# Patient Record
Sex: Female | Born: 1960 | Race: White | Hispanic: No | Marital: Married | Smoking: Former smoker
Health system: Southern US, Community
[De-identification: ages and names within clinical notes are randomized; demographics above are authoritative.]

## PROBLEM LIST (undated history)

## (undated) DIAGNOSIS — J189 Pneumonia, unspecified organism: Secondary | ICD-10-CM

## (undated) DIAGNOSIS — E0590 Thyrotoxicosis, unspecified without thyrotoxic crisis or storm: Secondary | ICD-10-CM

## (undated) DIAGNOSIS — K219 Gastro-esophageal reflux disease without esophagitis: Secondary | ICD-10-CM

## (undated) DIAGNOSIS — Z8619 Personal history of other infectious and parasitic diseases: Secondary | ICD-10-CM

## (undated) DIAGNOSIS — L821 Other seborrheic keratosis: Secondary | ICD-10-CM

## (undated) DIAGNOSIS — E079 Disorder of thyroid, unspecified: Secondary | ICD-10-CM

## (undated) DIAGNOSIS — F419 Anxiety disorder, unspecified: Secondary | ICD-10-CM

## (undated) DIAGNOSIS — E039 Hypothyroidism, unspecified: Secondary | ICD-10-CM

## (undated) DIAGNOSIS — D649 Anemia, unspecified: Secondary | ICD-10-CM

## (undated) DIAGNOSIS — G4700 Insomnia, unspecified: Secondary | ICD-10-CM

## (undated) DIAGNOSIS — N905 Atrophy of vulva: Secondary | ICD-10-CM

## (undated) HISTORY — DX: Other seborrheic keratosis: L82.1

## (undated) HISTORY — DX: Anemia, unspecified: D64.9

## (undated) HISTORY — DX: Disorder of thyroid, unspecified: E07.9

## (undated) HISTORY — DX: Atrophy of vulva: N90.5

## (undated) HISTORY — PX: ABDOMINAL HYSTERECTOMY: SHX81

## (undated) HISTORY — DX: Insomnia, unspecified: G47.00

## (undated) HISTORY — DX: Personal history of other infectious and parasitic diseases: Z86.19

## (undated) HISTORY — DX: Anxiety disorder, unspecified: F41.9

---
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
## 2001-03-20 HISTORY — PX: SUPRACERVICAL ABDOMINAL HYSTERECTOMY: SHX5393

## 2004-01-12 ENCOUNTER — Ambulatory Visit: Payer: Self-pay

## 2005-03-24 ENCOUNTER — Ambulatory Visit: Payer: Self-pay

## 2006-01-19 ENCOUNTER — Ambulatory Visit: Payer: Self-pay | Admitting: Internal Medicine

## 2007-01-22 ENCOUNTER — Ambulatory Visit: Payer: Self-pay | Admitting: Internal Medicine

## 2007-01-22 ENCOUNTER — Ambulatory Visit: Payer: Self-pay | Admitting: Family Medicine

## 2008-01-29 ENCOUNTER — Ambulatory Visit: Payer: Self-pay | Admitting: Internal Medicine

## 2009-02-25 ENCOUNTER — Ambulatory Visit: Payer: Self-pay | Admitting: Internal Medicine

## 2009-03-10 ENCOUNTER — Ambulatory Visit: Payer: Self-pay | Admitting: Internal Medicine

## 2009-08-25 ENCOUNTER — Ambulatory Visit: Payer: Self-pay | Admitting: Otolaryngology

## 2009-09-27 ENCOUNTER — Ambulatory Visit: Payer: Self-pay | Admitting: Otolaryngology

## 2010-04-06 ENCOUNTER — Ambulatory Visit: Payer: Self-pay

## 2010-04-07 ENCOUNTER — Ambulatory Visit: Payer: Self-pay

## 2010-10-20 ENCOUNTER — Ambulatory Visit: Payer: Self-pay

## 2011-03-16 ENCOUNTER — Ambulatory Visit: Payer: Self-pay

## 2011-03-17 DIAGNOSIS — E559 Vitamin D deficiency, unspecified: Secondary | ICD-10-CM | POA: Insufficient documentation

## 2011-04-11 ENCOUNTER — Ambulatory Visit: Payer: Self-pay

## 2011-07-20 ENCOUNTER — Ambulatory Visit: Payer: Self-pay | Admitting: Internal Medicine

## 2012-03-11 LAB — HM PAP SMEAR: HM PAP: NEGATIVE

## 2012-03-22 ENCOUNTER — Ambulatory Visit: Payer: Self-pay | Admitting: Internal Medicine

## 2012-05-01 ENCOUNTER — Ambulatory Visit: Payer: Self-pay

## 2012-06-18 HISTORY — PX: THYROIDECTOMY: SHX17

## 2012-06-19 ENCOUNTER — Ambulatory Visit: Payer: Self-pay | Admitting: Anesthesiology

## 2012-06-19 LAB — BASIC METABOLIC PANEL
Anion Gap: 6 — ABNORMAL LOW (ref 7–16)
BUN: 9 mg/dL (ref 7–18)
Calcium, Total: 8.7 mg/dL (ref 8.5–10.1)
Chloride: 106 mmol/L (ref 98–107)
Co2: 26 mmol/L (ref 21–32)
Creatinine: 0.66 mg/dL (ref 0.60–1.30)
EGFR (African American): >60
EGFR (Non-African Amer.): >60
Glucose: 97 mg/dL (ref 65–99)
Osmolality: 274 (ref 275–301)
Potassium: 4 mmol/L (ref 3.5–5.1)
Sodium: 138 mmol/L (ref 136–145)

## 2012-06-19 LAB — CBC
HCT: 37 % (ref 35.0–47.0)
HGB: 12.8 g/dL (ref 12.0–16.0)
MCHC: 34.5 g/dL (ref 32.0–36.0)
MCV: 94 fL (ref 80–100)
Platelet: 247 10*3/uL (ref 150–440)
RBC: 3.95 10*6/uL (ref 3.80–5.20)
WBC: 8.2 10*3/uL (ref 3.6–11.0)

## 2012-06-19 LAB — T4, FREE: Free Thyroxine: 0.92 ng/dL (ref 0.76–1.46)

## 2012-06-24 ENCOUNTER — Ambulatory Visit: Payer: Self-pay | Admitting: Otolaryngology

## 2012-06-26 LAB — PATHOLOGY REPORT

## 2013-03-11 ENCOUNTER — Ambulatory Visit: Payer: Self-pay | Admitting: Family Medicine

## 2013-03-11 LAB — HM DEXA SCAN

## 2013-05-13 ENCOUNTER — Ambulatory Visit: Payer: Self-pay | Admitting: Family Medicine

## 2013-05-26 ENCOUNTER — Ambulatory Visit: Payer: Self-pay | Admitting: Family Medicine

## 2013-05-26 LAB — HM MAMMOGRAPHY: HM Mammogram: NEGATIVE

## 2014-03-16 ENCOUNTER — Ambulatory Visit: Payer: Self-pay | Admitting: Family Medicine

## 2014-03-24 ENCOUNTER — Ambulatory Visit: Payer: Self-pay | Admitting: Family Medicine

## 2014-03-31 ENCOUNTER — Ambulatory Visit: Payer: Self-pay | Admitting: Family Medicine

## 2014-07-10 NOTE — Op Note (Signed)
PATIENT NAME:  Lisa Simmons, Lisa Simmons MR#:  [id number] DATE OF BIRTH:  Jul 17, 1960  DATE OF PROCEDURE:  06/24/2012  PREOPERATIVE DIAGNOSES:  1. Multiple thyroid nodules with thyroid enlargement.  2. Hyperthyroidism.  3. Dysphasia.   POSTOPERATIVE DIAGNOSES:  1. Multiple thyroid nodules with thyroid enlargement.  2. Hyperthyroidism.  3. Dysphasia.   OPERATIVE PROCEDURE: Total thyroidectomy.  SURGEON: Margaretha Sheffield, MD  ASSISTANT: Jerene Bears, MD  ANESTHESIA: General.   COMPLICATIONS: None.   TOTAL ESTIMATED BLOOD LOSS: 75 mL.   DESCRIPTION OF PROCEDURE: The patient was given general anesthesia by oral endotracheal intubation. A special monitored endotracheal tube was used to be able to continuously monitor the laryngeal nerves throughout the procedure. The head was extended some. A small shoulder roll was placed. The neck was marked about 2 fingerbreadths above the sternal notch and 6 mL of 1% Xylocaine with epi 1:100,000 was used for infiltration of the skin in this area. She was prepped and draped in sterile fashion.   Incision was made through the skin and subcu down to the platysmal layer. This was divided as well. There was very little platysma medially. The incision line itself was only about 2.5 inches long. The strap muscles were divided in the midline and the right side was addressed first. There was a very large nodule that was coming across the midline and was stretching the strap muscles significantly. The strap muscles were densely adherent to the thyroid gland in this area and had to be sharply dissected. There was a significant amount of oozing. Once I got around the lateral border of the large nodule until there were inferiorly attachments. I freed up and found two large nodules inferiorly to the thyroid gland. These were freed up as well and brought up into the wound and tried to rotate some. The gland was still attached superiorly. There was one parathyroid gland that was  found inferiorly. Dissection was carried long using the Harmonic scalpel and blunt dissection. Superiorly, the thyroid gland went up quite high. The superior attachments were freed up to show the superior thyroid vessels. These were cut across using the Harmonic scalpel. Once the superior border was freed up and I could loosen up some of the superior thyroid, the remaining portion of the gland could be medialized. A superior thyroid gland was found as the attachments were being taken down. An inferior parathyroid gland was found on the inferior portion as well. Medially, the recurrent laryngeal nerve was found. Once the gland was rotated, this came into clear view. It was relatively large and stimulated well. There were no attachments near it and the Berry's ligament was cut across with the nerve in direct view to make sure there was no injury to the nerve. The remaining attachments to the trachea were freed up and the entire right thyroid gland was removed and a very large middle nodule with inferior nodules and upper lobe that looked more normal. This was left attached at the thyroid gland. During this time, it was felt that the electrodes on the orotracheal tube had moved some and was not getting as good a stimulation again, and this was visualized, and you could see that this had gone down into the trachea and was now not stimulating properly. It was pulled back some, but it was hard to tell where there was blood on this and not working right, so the tube was removed and a fresh oral endotracheal tube was placed with a fresh electrode on it. This  was then checked and was seemingly working much better. We were not getting the constant feedback from it, and the right nerve was stimulated and worked appropriately.   Dissection on the left gland was then done, elevating the strap muscles and going around the lateral border. Superiorly, the attachments were loosened up the superior pole. This went quite high and  posterior. As I freed it up, I found the superior laryngeal nerve and this was dissected freed and left intact. Once the superior attachments were freed, I could pull this down, I could medialize the gland somewhat. There were inferior attachments as we pulled it medially and could feel two more nodules that were inferior and lateral. These were freed up from the overlying tissue and medialized as well. The inferior attachments were freed up and the inferior thyroid vessels were cut across with the Harmonic as well. The recurrent laryngeal nerve was then found lying in the bed. There was one superior parathyroid that was found and left intact. I was not sure if I found an inferior one on this side. There was a lot of fatty tissue that was interspersed in the neck. The Berry's ligament was come across and freed up while the nerve was in direct vision. The nerve was stimulated before and after to make sure that was still continuing to work well. This freed up the entire gland and the whole gland was removed. One of the inferior nodules tore off of the specimen while it was being retracted and this was sent separately. This was from the left inferior thyroid gland. The specimen was tagged at the left superior pole.   The wound was irrigated on both sides. There was no obvious bleeding on either side. The nerves were clearly seen and could be stimulated with good response. Surgicel was placed in the bed and then #10-French TLS drains were placed bilaterally crisscrossed in the midline to place the drains in the bed. These were placed to low continuous Vacutainer suction. The wound was then closed using 4-0 Vicryl for the strap muscles and then the platysma was closed with 4-0 Vicryl. The dermis was then closed with 4-0 Vicryl and the skin edges were held in apposition with a 6-0 Prolene running locking suture. The wound was covered then with bacitracin followed by Telfa and Tegaderm. The drains were taped to the skin.  The patient tolerated the procedure well. She was awakened and taken to the recovery room in satisfactory condition. There were no operative complications.    ____________________________ Huey Romans, MD phj:es D: 06/24/2012 11:37:23 ET T: 06/24/2012 12:46:23 ET JOB#: [id number]  cc: Huey Romans, MD, <Dictator> Huey Romans MD ELECTRONICALLY SIGNED 07/08/2012 8:37

## 2014-08-27 ENCOUNTER — Ambulatory Visit
Admission: RE | Admit: 2014-08-27 | Payer: BC Managed Care – PPO | Source: Ambulatory Visit | Admitting: Gastroenterology

## 2014-08-27 ENCOUNTER — Encounter: Admission: RE | Payer: Self-pay | Source: Ambulatory Visit

## 2014-08-27 SURGERY — COLONOSCOPY
Anesthesia: Choice

## 2015-02-21 LAB — BASIC METABOLIC PANEL
BUN: 9 mg/dL (ref 4–21)
CREATININE: 0.7 mg/dL (ref 0.5–1.1)
GLUCOSE: 116 mg/dL
POTASSIUM: 3.9 mmol/L (ref 3.4–5.3)
Sodium: 140 mmol/L (ref 137–147)

## 2015-02-21 LAB — TSH: TSH: 0.18 u[IU]/mL — AB (ref 0.41–5.90)

## 2015-03-10 ENCOUNTER — Encounter: Payer: Self-pay | Admitting: Family Medicine

## 2015-03-11 ENCOUNTER — Encounter: Payer: Self-pay | Admitting: Family Medicine

## 2015-03-11 DIAGNOSIS — M1990 Unspecified osteoarthritis, unspecified site: Secondary | ICD-10-CM | POA: Insufficient documentation

## 2015-03-11 DIAGNOSIS — G4700 Insomnia, unspecified: Secondary | ICD-10-CM | POA: Insufficient documentation

## 2015-03-11 DIAGNOSIS — F419 Anxiety disorder, unspecified: Secondary | ICD-10-CM | POA: Insufficient documentation

## 2015-03-11 DIAGNOSIS — Z889 Allergy status to unspecified drugs, medicaments and biological substances status: Secondary | ICD-10-CM | POA: Insufficient documentation

## 2015-03-11 DIAGNOSIS — N951 Menopausal and female climacteric states: Secondary | ICD-10-CM | POA: Insufficient documentation

## 2015-03-11 DIAGNOSIS — E890 Postprocedural hypothyroidism: Secondary | ICD-10-CM | POA: Insufficient documentation

## 2015-03-11 DIAGNOSIS — R51 Headache: Secondary | ICD-10-CM

## 2015-03-11 DIAGNOSIS — R519 Headache, unspecified: Secondary | ICD-10-CM | POA: Insufficient documentation

## 2015-03-11 DIAGNOSIS — G8929 Other chronic pain: Secondary | ICD-10-CM | POA: Insufficient documentation

## 2015-03-11 DIAGNOSIS — M8580 Other specified disorders of bone density and structure, unspecified site: Secondary | ICD-10-CM | POA: Insufficient documentation

## 2015-03-16 ENCOUNTER — Other Ambulatory Visit: Payer: Self-pay | Admitting: Family Medicine

## 2015-03-16 ENCOUNTER — Encounter: Payer: Self-pay | Admitting: Family Medicine

## 2015-03-16 ENCOUNTER — Ambulatory Visit (INDEPENDENT_AMBULATORY_CARE_PROVIDER_SITE_OTHER): Payer: BC Managed Care – PPO | Admitting: Family Medicine

## 2015-03-16 VITALS — BP 120/84 | HR 85 | Temp 98.2°F | Resp 16 | Ht 63.16 in | Wt 162.0 lb

## 2015-03-16 DIAGNOSIS — E559 Vitamin D deficiency, unspecified: Secondary | ICD-10-CM

## 2015-03-16 DIAGNOSIS — Z0000 Encounter for general adult medical examination without abnormal findings: Secondary | ICD-10-CM

## 2015-03-16 DIAGNOSIS — Z1231 Encounter for screening mammogram for malignant neoplasm of breast: Secondary | ICD-10-CM

## 2015-03-16 NOTE — Progress Notes (Signed)
Patient: Lisa Simmons, Female    DOB: 03-Aug-1960, 54 y.o.   MRN: [id number] Visit Date: 03/16/2015  Today's Provider: Lelon Huh, MD   Chief Complaint  Patient presents with  . Annual Exam  . Hypothyroidism  . Anxiety   Subjective:    Annual physical exam Lisa Simmons is a 54 y.o. female who presents today for health maintenance and complete physical. She feels well. She reports exercising none. She reports she is sleeping poorly.  -----------------------------------------------------------------  Follow-up for Vitamin D Deficiency from 03/10/2014; recommended that she start a vitamin D supplement 2,000 units qd. Follow-up Anxiety from 03/10/2014; no changes were made. Follow-up for hypothyroidism from 03/10/2014; decreased levothyroxine to 75 mcg. Sees Dr Ladene Artist yearly who monitors thyroid functions.       Review of Systems  Constitutional: Positive for chills, diaphoresis, fatigue and unexpected weight change.  HENT: Positive for voice change.        Deeper voice  Eyes: Positive for visual disturbance.  Respiratory: Negative.   Cardiovascular: Positive for palpitations.  Gastrointestinal: Negative.   Endocrine: Positive for cold intolerance and heat intolerance.  Genitourinary: Negative.   Musculoskeletal: Positive for back pain and arthralgias.  Skin: Negative.   Allergic/Immunologic: Negative.   Neurological: Negative.   Hematological: Negative.   Psychiatric/Behavioral: Positive for sleep disturbance and decreased concentration.       Having troubled staying asleep/6-7 hours per night    Social History      She  reports that she quit smoking about 29 years ago. Her smoking use included Cigarettes. She has a 7 pack-year smoking history. She does not have any smokeless tobacco history on file. She reports that she drinks alcohol. She reports that she does not use illicit drugs.       Social History   Social History  . Marital Status: Married    Spouse Name: N/A  . Number of Children: 3  . Years of Education: N/A   Occupational History  . Teacher    Social History Main Topics  . Smoking status: Former Smoker -- 1.00 packs/day for 7 years    Types: Cigarettes    Quit date: 03/20/1986  . Smokeless tobacco: None  . Alcohol Use: 0.0 oz/week    0 Standard drinks or equivalent per week     Comment: 1-2 drinks der year  . Drug Use: No  . Sexual Activity: Not Asked   Other Topics Concern  . None   Social History Narrative    Past Medical History  Diagnosis Date  . History of chicken pox   . History of measles   . History of mumps      Patient Active Problem List   Diagnosis Date Noted  . History of allergy 03/11/2015  . Anxiety 03/11/2015  . Arthritis 03/11/2015  . Chronic headache 03/11/2015  . Hypothyroidism, postop 03/11/2015  . Insomnia 03/11/2015  . Menopausal symptom 03/11/2015  . Osteopenia 03/11/2015  . Vitamin D deficiency 03/17/2011    Past Surgical History  Procedure Laterality Date  . Thyroidectomy  06/2012    Dr. Jerral Bonito; due to multinodular goiter  . Abdominal hysterectomy  2003    due to Endometriosis and severe Anemiaabdominal; Laproscopic; still has ovaries and vervix  . Cesarean section  1989  . Dilation and curettage of uterus  1983    x2  . Tonsillectomy  1970    Family History        Family Status  Relation  Status Death Age  . Mother Alive   . Father Deceased     Cause of Death: overdose; substance abuse  . Sister Alive     substance abuse  . Maternal Grandfather Deceased   . Paternal Grandfather Deceased   . Sister Alive   . Sister Alive   . Paternal Grandmother Deceased         Her family history includes Alcohol abuse in her sister; Depression in her father; Heart disease in her father, maternal grandfather, maternal grandmother, paternal grandfather, and paternal grandmother; Hypothyroidism in her mother; Stroke in her maternal grandfather.    Allergies  Allergen  Reactions  . Avelox  [Moxifloxacin Hcl In Nacl]     Other reaction(s): Rapid pulse  . Iodine     Other reaction(s): Hives  . Penicillin G Rash    Previous Medications   ALPRAZOLAM (NIRAVAM) 0.25 MG DISSOLVABLE TABLET    Take 1 tablet by mouth at bedtime as needed.   LEVOTHYROXINE (SYNTHROID, LEVOTHROID) 100 MCG TABLET    Take 1 tablet by mouth every other day. Reported on 03/16/2015   LEVOTHYROXINE (SYNTHROID, LEVOTHROID) 88 MCG TABLET    Take 1 tablet by mouth every other day.   SERTRALINE (ZOLOFT) 50 MG TABLET    Take 1 tablet by mouth daily.    Patient Care Team: Birdie Sons, MD as PCP - General (Family Medicine) Margaretha Sheffield, MD (Otolaryngology)     Objective:   Vitals: BP 120/84 mmHg  Pulse 85  Temp(Src) 98.2 F (36.8 C) (Oral)  Resp 16  Ht 5' 3.16" (1.604 m)  Wt 162 lb (73.483 kg)  BMI 28.56 kg/m2  SpO2 96%  LMP 03/16/2015   Physical Exam   General Appearance:    Alert, cooperative, no distress, appears stated age  Head:    Normocephalic, without obvious abnormality, atraumatic  Eyes:    PERRL, conjunctiva/corneas clear, EOM's intact, fundi    benign, both eyes  Ears:    Normal TM's and external ear canals, both ears  Nose:   Nares normal, septum midline, mucosa normal, no drainage    or sinus tenderness  Throat:   Lips, mucosa, and tongue normal; teeth and gums normal  Neck:   Supple, symmetrical, trachea midline, no adenopathy;    thyroid:  no enlargement/tenderness/nodules; no carotid   bruit or JVD  Back:     Symmetric, no curvature, ROM normal, no CVA tenderness  Lungs:     Clear to auscultation bilaterally, respirations unlabored  Chest Wall:    No tenderness or deformity   Heart:    Regular rate and rhythm, S1 and S2 normal, no murmur, rub   or gallop  Breast Exam:    normal appearance, no masses or tenderness, deferred  Abdomen:     Soft, non-tender, bowel sounds active all four quadrants,    no masses, no organomegaly  Pelvic:    deferred    Extremities:   Extremities normal, atraumatic, no cyanosis or edema  Pulses:   2+ and symmetric all extremities  Skin:   Skin color, texture, turgor normal, no rashes or lesions  Lymph nodes:   Cervical, supraclavicular, and axillary nodes normal  Neurologic:   CNII-XII intact, normal strength, sensation and reflexes    throughout     Depression Screen PHQ 2/9 Scores 03/16/2015  PHQ - 2 Score 0  PHQ- 9 Score 5      Assessment & Plan:     Routine Health Maintenance and Physical Exam  Exercise Activities and Dietary recommendations Goals    None      Immunization History  Administered Date(s) Administered  . Tdap 03/10/2013    Health Maintenance  Topic Date Due  . HIV Screening  06/18/1977  . COLONOSCOPY  06/18/2012  . INFLUENZA VACCINE  Had at work  . PAP SMEAR  No longer indicated  . MAMMOGRAM  05/27/2015  . TETANUS/TDAP  03/11/2023      Discussed health benefits of physical activity, and encouraged her to engage in regular exercise appropriate for her age and condition.    --------------------------------------------------------------------  1. Annual physical exam She is scared to do prep for colonoscopy since she has history of passing out prior to vomiting. She was given order form for Cologuard and advised this should be done every 3 years as alternative to colonoscopy.  - Lipid panel - Comprehensive metabolic panel  2. Vitamin D deficiency  - VITAMIN D 25 Hydroxy (Vit-D Deficiency, Fractures)

## 2015-03-16 NOTE — Patient Instructions (Signed)
Please call the [facility] at [facility] at [phone] to schedule your mammogram.

## 2015-03-17 ENCOUNTER — Ambulatory Visit
Admission: RE | Admit: 2015-03-17 | Discharge: 2015-03-17 | Disposition: A | Discharge status: Home / Self Care | Payer: BC Managed Care – PPO | Source: Ambulatory Visit | Attending: Family Medicine | Admitting: Family Medicine

## 2015-03-17 DIAGNOSIS — Z1231 Encounter for screening mammogram for malignant neoplasm of breast: Secondary | ICD-10-CM | POA: Diagnosis not present

## 2015-03-18 LAB — COMPREHENSIVE METABOLIC PANEL
A/G RATIO: 1.9 (ref 1.1–2.5)
ALBUMIN: 4.4 g/dL (ref 3.5–5.5)
ALT: 16 IU/L (ref 0–32)
AST: 16 IU/L (ref 0–40)
Alkaline Phosphatase: 101 IU/L (ref 39–117)
BILIRUBIN TOTAL: 0.4 mg/dL (ref 0.0–1.2)
BUN / CREAT RATIO: 15 (ref 9–23)
BUN: 11 mg/dL (ref 6–24)
CALCIUM: 9.2 mg/dL (ref 8.7–10.2)
CHLORIDE: 103 mmol/L (ref 96–106)
CO2: 26 mmol/L (ref 18–29)
Creatinine, Ser: 0.72 mg/dL (ref 0.57–1.00)
GFR, EST AFRICAN AMERICAN: 111 mL/min/{1.73_m2} (ref >59)
GFR, EST NON AFRICAN AMERICAN: 97 mL/min/{1.73_m2} (ref >59)
GLOBULIN, TOTAL: 2.3 g/dL (ref 1.5–4.5)
Glucose: 104 mg/dL — ABNORMAL HIGH (ref 65–99)
POTASSIUM: 4.4 mmol/L (ref 3.5–5.2)
SODIUM: 143 mmol/L (ref 134–144)
TOTAL PROTEIN: 6.7 g/dL (ref 6.0–8.5)

## 2015-03-18 LAB — LIPID PANEL
Chol/HDL Ratio: 3.5 ratio units (ref 0.0–4.4)
Cholesterol, Total: 212 mg/dL — ABNORMAL HIGH (ref 100–199)
HDL: 61 mg/dL (ref >39)
LDL Calculated: 131 mg/dL — ABNORMAL HIGH (ref 0–99)
TRIGLYCERIDES: 100 mg/dL (ref 0–149)
VLDL CHOLESTEROL CAL: 20 mg/dL (ref 5–40)

## 2015-03-18 LAB — VITAMIN D 25 HYDROXY (VIT D DEFICIENCY, FRACTURES): VIT D 25 HYDROXY: 18.6 ng/mL — AB (ref 30.0–100.0)

## 2015-03-22 LAB — COLOGUARD: COLOGUARD: NEGATIVE

## 2015-03-23 ENCOUNTER — Other Ambulatory Visit: Payer: Self-pay | Admitting: Family Medicine

## 2015-03-23 DIAGNOSIS — N63 Unspecified lump in unspecified breast: Secondary | ICD-10-CM

## 2015-03-29 ENCOUNTER — Ambulatory Visit
Admission: RE | Admit: 2015-03-29 | Discharge: 2015-03-29 | Disposition: A | Discharge status: Home / Self Care | Payer: BC Managed Care – PPO | Source: Ambulatory Visit | Attending: Family Medicine | Admitting: Family Medicine

## 2015-03-29 DIAGNOSIS — N63 Unspecified lump in unspecified breast: Secondary | ICD-10-CM

## 2015-03-29 DIAGNOSIS — N6002 Solitary cyst of left breast: Secondary | ICD-10-CM | POA: Diagnosis not present

## 2015-04-02 ENCOUNTER — Encounter: Payer: Self-pay | Admitting: *Deleted

## 2015-04-21 ENCOUNTER — Ambulatory Visit (INDEPENDENT_AMBULATORY_CARE_PROVIDER_SITE_OTHER): Payer: BC Managed Care – PPO | Admitting: Family Medicine

## 2015-04-21 ENCOUNTER — Encounter: Payer: Self-pay | Admitting: Family Medicine

## 2015-04-21 VITALS — BP 120/80 | HR 80 | Temp 98.2°F | Resp 16 | Ht 63.0 in | Wt 166.0 lb

## 2015-04-21 DIAGNOSIS — N390 Urinary tract infection, site not specified: Secondary | ICD-10-CM | POA: Diagnosis not present

## 2015-04-21 DIAGNOSIS — R300 Dysuria: Secondary | ICD-10-CM

## 2015-04-21 LAB — POCT URINALYSIS DIPSTICK
Bilirubin, UA: NEGATIVE
GLUCOSE UA: NEGATIVE
Ketones, UA: NEGATIVE
Leukocytes, UA: NEGATIVE
Nitrite, UA: NEGATIVE
PH UA: 7
Protein, UA: NEGATIVE
RBC UA: NEGATIVE
SPEC GRAV UA: 1.01
UROBILINOGEN UA: 0.2

## 2015-04-21 MED ORDER — NITROFURANTOIN MONOHYD MACRO 100 MG PO CAPS: 100.0000 mg | ORAL_CAPSULE | Freq: Two times a day (BID) | ORAL | Status: AC

## 2015-04-21 NOTE — Progress Notes (Signed)
Patient: Lisa Simmons Female    DOB: 1960-12-31   55 y.o.   MRN: [id number] Visit Date: 04/21/2015  Today's Provider: Lelon Huh, MD   Chief Complaint  Patient presents with  . Dysuria    x 1 week   Subjective:    Dysuria  This is a new problem. Episode onset: 1 week ago. The problem occurs every urination. The problem has been gradually worsening. The quality of the pain is described as burning. The pain is mild. There has been no fever. Associated symptoms include frequency, nausea and urgency. Pertinent negatives include no chills, discharge, flank pain, hematuria, hesitancy, sweats or vomiting. She has tried increased fluids and home medications (AZO ) for the symptoms. The treatment provided mild relief.  Patient states her symptoms improved slightly with increasing fluid intake and taking AZO but then returned the next day. Patient states her urine has a strong odor and in darker than normal.      Allergies  Allergen Reactions  . Avelox  [Moxifloxacin Hcl In Nacl]     Other reaction(s): Rapid pulse  . Iodine     Other reaction(s): Hives  . Penicillin G Rash   Previous Medications   ALPRAZOLAM (NIRAVAM) 0.25 MG DISSOLVABLE TABLET    Take 1 tablet by mouth at bedtime as needed.   LEVOTHYROXINE (SYNTHROID, LEVOTHROID) 100 MCG TABLET    Take 1 tablet by mouth every other day. Reported on 03/16/2015   LEVOTHYROXINE (SYNTHROID, LEVOTHROID) 88 MCG TABLET    Take 1 tablet by mouth every other day.   SERTRALINE (ZOLOFT) 50 MG TABLET    Take 1 tablet by mouth daily.    Review of Systems  Constitutional: Positive for fatigue. Negative for fever, chills and appetite change.  Respiratory: Negative for chest tightness and shortness of breath.   Cardiovascular: Negative for chest pain and palpitations.  Gastrointestinal: Positive for nausea. Negative for vomiting and abdominal pain.  Genitourinary: Positive for dysuria, urgency and frequency. Negative for hesitancy,  hematuria and flank pain.  Neurological: Negative for dizziness, weakness and light-headedness.    Social History  Substance Use Topics  . Smoking status: Former Smoker -- 1.00 packs/day for 7 years    Types: Cigarettes    Quit date: 03/20/1986  . Smokeless tobacco: Not on file  . Alcohol Use: 0.0 oz/week    0 Standard drinks or equivalent per week     Comment: 1-2 drinks der year   Objective:   BP 120/80 mmHg  Pulse 80  Temp(Src) 98.2 F (36.8 C) (Oral)  Resp 16  Ht 5\' 3"  (1.6 m)  Wt 166 lb (75.297 kg)  BMI 29.41 kg/m2  LMP 03/16/2015  Physical Exam  General appearance: alert, well developed, well nourished, cooperative and in no distress Head: Normocephalic, without obvious abnormality, atraumatic Lungs: Respirations even and unlabored Extremities: No gross deformities Skin: Skin color, texture, turgor normal. No rashes seen  Psych: Appropriate mood and affect. Neurologic: Mental status: Alert, oriented to person, place, and time, thought content appropriate.  Results for orders placed or performed in visit on 04/21/15  POCT Urinalysis Dipstick  Result Value Ref Range   Color, UA yellow    Clarity, UA clear    Glucose, UA Negative    Bilirubin, UA Negative    Ketones, UA Negative    Spec Grav, UA 1.010    Blood, UA Negative    pH, UA 7.0    Protein, UA Negative  Urobilinogen, UA 0.2    Nitrite, UA Negative    Leukocytes, UA Negative Negative       Assessment & Plan:     1. Urinary tract infection without hematuria, site unspecified Classic symptoms which she has experienced many times in past. Will start Macrobid 100BID #14 while awaiting culture results.   2. Dysuria  - POCT Urinalysis Dipstick - Urine Culture       Lelon Huh, MD  [facility]

## 2015-04-23 ENCOUNTER — Telehealth: Payer: Self-pay | Admitting: *Deleted

## 2015-04-23 LAB — URINE CULTURE

## 2015-04-23 NOTE — Telephone Encounter (Signed)
Macrobid is slow acting and often takes 3-4 day for improvement. She can take OTC Azo to help with burning over the weekend. Alternative antibiotics are related to Avelox, which she had a very bad reaction to.

## 2015-04-23 NOTE — Telephone Encounter (Signed)
Patient was notified of results. Patient expressed understanding. 

## 2015-04-23 NOTE — Telephone Encounter (Signed)
-----   Message from Birdie Sons, MD sent at 04/23/2015 12:47 PM EST ----- Cultures confirm infection that should clear up completely with Macrobid.

## 2015-04-23 NOTE — Telephone Encounter (Signed)
Patient stated that she is still having burning sensation.

## 2015-04-23 NOTE — Telephone Encounter (Signed)
Patient notified

## 2015-04-29 ENCOUNTER — Telehealth: Payer: Self-pay | Admitting: Family Medicine

## 2015-04-29 NOTE — Telephone Encounter (Signed)
Pt was in last week for UTI.  She has finished her antibiotic and still is having symptom.  Please advise  Please call back [phone]  Thanks,  Con Memos

## 2015-04-30 MED ORDER — LEVOFLOXACIN 500 MG PO TABS: 500.0000 mg | ORAL_TABLET | Freq: Every day | ORAL | Status: DC

## 2015-04-30 NOTE — Telephone Encounter (Signed)
Advised patient as below. Medication sent into pharmacy.

## 2015-04-30 NOTE — Telephone Encounter (Signed)
Left message to call back  

## 2015-04-30 NOTE — Telephone Encounter (Signed)
Cultures show that infection is sensitive to Levaquin. Levaquin is related to Avelox which she is allergic to, but we did prescribed Levaquin for her last year when she had pneumonia.  If she tolerated Levaquin before, we can change to Levaquin 500mg  one daily for 7 days.

## 2015-04-30 NOTE — Telephone Encounter (Signed)
Tried calling patient. Left message to call back. 

## 2015-05-06 ENCOUNTER — Telehealth: Payer: Self-pay | Admitting: Family Medicine

## 2015-05-06 NOTE — Telephone Encounter (Signed)
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  She has various days throughout the rest of the year.  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    She needs to be excused on those various days.  Her call back is [phone]  Thanks Con Memos

## 2015-05-06 NOTE — Telephone Encounter (Signed)
Please advise 

## 2015-05-07 ENCOUNTER — Encounter: Payer: Self-pay | Admitting: Family Medicine

## 2015-05-07 DIAGNOSIS — K589 Irritable bowel syndrome without diarrhea: Secondary | ICD-10-CM | POA: Insufficient documentation

## 2015-05-07 NOTE — Telephone Encounter (Signed)
Pt came in to the office @4 :30 on 05/07/2015 to pick up the requested letter.  Per Linus Salmons the letter is not ready.  I advised pt and pt states she will need this today.  Pt states the testing is Tuesday and she works in [place] and can not come in Monday to pick this up.  Linus Salmons stated she will send another note to Dr Caryn Section.  I advised and advised a letter can take up to 7 to 10 days.  Pt stated that this has happen before and she will find another doctor.  Pt walked out of the office/MW

## 2015-05-07 NOTE — Telephone Encounter (Signed)
Letter has been completed

## 2015-05-25 ENCOUNTER — Ambulatory Visit: Payer: BC Managed Care – PPO | Admitting: Family Medicine

## 2015-06-01 ENCOUNTER — Other Ambulatory Visit: Payer: Self-pay | Admitting: Family Medicine

## 2015-07-09 ENCOUNTER — Encounter: Payer: Self-pay | Admitting: Family Medicine

## 2015-07-09 ENCOUNTER — Ambulatory Visit (INDEPENDENT_AMBULATORY_CARE_PROVIDER_SITE_OTHER): Payer: BC Managed Care – PPO | Admitting: Family Medicine

## 2015-07-09 VITALS — BP 112/72 | HR 79 | Temp 97.8°F | Resp 16 | Wt 165.0 lb

## 2015-07-09 DIAGNOSIS — J029 Acute pharyngitis, unspecified: Secondary | ICD-10-CM | POA: Diagnosis not present

## 2015-07-09 DIAGNOSIS — J069 Acute upper respiratory infection, unspecified: Secondary | ICD-10-CM | POA: Diagnosis not present

## 2015-07-09 LAB — POCT RAPID STREP A (OFFICE): Rapid Strep A Screen: NEGATIVE

## 2015-07-09 NOTE — Patient Instructions (Signed)
Upper Respiratory Infection, Adult Most upper respiratory infections (URIs) are a viral infection of the air passages leading to the lungs. A URI affects the nose, throat, and upper air passages. The most common type of URI is nasopharyngitis and is typically referred to as "the common cold." URIs run their course and usually go away on their own. Most of the time, a URI does not require medical attention, but sometimes a bacterial infection in the upper airways can follow a viral infection. This is called a secondary infection. Sinus and middle ear infections are common types of secondary upper respiratory infections. Bacterial pneumonia can also complicate a URI. A URI can worsen asthma and chronic obstructive pulmonary disease (COPD). Sometimes, these complications can require emergency medical care and may be life threatening.  CAUSES Almost all URIs are caused by viruses. A virus is a type of germ and can spread from one person to another.  RISKS FACTORS You may be at risk for a URI if:   You smoke.   You have chronic heart or lung disease.  You have a weakened defense (immune) system.   You are very young or very old.   You have nasal allergies or asthma.  You work in crowded or poorly ventilated areas.  You work in health care facilities or schools. SIGNS AND SYMPTOMS  Symptoms typically develop 2-3 days after you come in contact with a cold virus. Most viral URIs last 7-10 days. However, viral URIs from the influenza virus (flu virus) can last 14-18 days and are typically more severe. Symptoms may include:   Runny or stuffy (congested) nose.   Sneezing.   Cough.   Sore throat.   Headache.   Fatigue.   Fever.   Loss of appetite.   Pain in your forehead, behind your eyes, and over your cheekbones (sinus pain).  Muscle aches.  DIAGNOSIS  Your health care provider may diagnose a URI by:  Physical exam.  Tests to check that your symptoms are not due to  another condition such as:  Strep throat.  Sinusitis.  Pneumonia.  Asthma. TREATMENT  A URI goes away on its own with time. It cannot be cured with medicines, but medicines may be prescribed or recommended to relieve symptoms. Medicines may help:  Reduce your fever.  Reduce your cough.  Relieve nasal congestion. HOME CARE INSTRUCTIONS   Take medicines only as directed by your health care provider.   Gargle warm saltwater or take cough drops to comfort your throat as directed by your health care provider.  Use a warm mist humidifier or inhale steam from a shower to increase air moisture. This may make it easier to breathe.  Drink enough fluid to keep your urine clear or pale yellow.   Eat soups and other clear broths and maintain good nutrition.   Rest as needed.   Return to work when your temperature has returned to normal or as your health care provider advises. You may need to stay home longer to avoid infecting others. You can also use a face mask and careful hand washing to prevent spread of the virus.  Increase the usage of your inhaler if you have asthma.   Do not use any tobacco products, including cigarettes, chewing tobacco, or electronic cigarettes. If you need help quitting, ask your health care provider. PREVENTION  The best way to protect yourself from getting a cold is to practice good hygiene.   Avoid oral or hand contact with people with cold   symptoms.   Wash your hands often if contact occurs.  There is no clear evidence that vitamin C, vitamin E, echinacea, or exercise reduces the chance of developing a cold. However, it is always recommended to get plenty of rest, exercise, and practice good nutrition.  SEEK MEDICAL CARE IF:   You are getting worse rather than better.   Your symptoms are not controlled by medicine.   You have chills.  You have worsening shortness of breath.  You have brown or red mucus.  You have yellow or brown nasal  discharge.  You have pain in your face, especially when you bend forward.  You have a fever.  You have swollen neck glands.  You have pain while swallowing.  You have white areas in the back of your throat. SEEK IMMEDIATE MEDICAL CARE IF:   You have severe or persistent:  Headache.  Ear pain.  Sinus pain.  Chest pain.  You have chronic lung disease and any of the following:  Wheezing.  Prolonged cough.  Coughing up blood.  A change in your usual mucus.  You have a stiff neck.  You have changes in your:  Vision.  Hearing.  Thinking.  Mood. MAKE SURE YOU:   Understand these instructions.  Will watch your condition.  Will get help right away if you are not doing well or get worse.   This information is not intended to replace advice given to you by your health care provider. Make sure you discuss any questions you have with your health care provider.   Document Released: 08/30/2000 Document Revised: 07/21/2014 Document Reviewed: 06/11/2013 Elsevier Interactive Patient Education 2016 Elsevier Inc.  

## 2015-07-09 NOTE — Progress Notes (Signed)
Patient: Lisa Simmons Female    DOB: 01/09/1961   55 y.o.   MRN: [id number] Visit Date: 07/09/2015  Today's Provider: Lelon Huh, MD   Chief Complaint  Patient presents with  . Sore Throat    x 2 days   Subjective:    Sore Throat  This is a new problem. Episode onset: 2 days ago. The problem has been unchanged. The pain is worse on the right side. Maximum temperature: low grade yesterday 99.5. Associated symptoms include headaches, a hoarse voice, a plugged ear sensation, neck pain, shortness of breath, swollen glands and trouble swallowing. Pertinent negatives include no abdominal pain, congestion, coughing, diarrhea, drooling, ear discharge, ear pain or vomiting. Associated symptoms comments: Painful swallowing. She has tried NSAIDs for the symptoms. The treatment provided mild relief.  Patient has white discharge on the back of her throat and red bumps around the [place].     Allergies  Allergen Reactions  . Avelox  [Moxifloxacin Hcl In Nacl]     Other reaction(s): Rapid pulse  . Iodine     Other reaction(s): Hives  . Penicillin G Rash   Previous Medications   ALPRAZOLAM (NIRAVAM) 0.25 MG DISSOLVABLE TABLET    Take 1 tablet by mouth at bedtime as needed.   LEVOTHYROXINE (SYNTHROID, LEVOTHROID) 100 MCG TABLET    Take 1 tablet by mouth daily. Reported on 03/16/2015   SERTRALINE (ZOLOFT) 50 MG TABLET    TAKE 1 TABLET BY MOUTH EVERY DAY    Review of Systems  Constitutional: Positive for fever, chills, diaphoresis and fatigue. Negative for appetite change.  HENT: Positive for hoarse voice, postnasal drip, sore throat, trouble swallowing and voice change. Negative for congestion, drooling, ear discharge, ear pain, mouth sores, nosebleeds, rhinorrhea, sinus pressure and sneezing.   Respiratory: Positive for shortness of breath. Negative for cough and chest tightness.   Cardiovascular: Negative for chest pain and palpitations.  Gastrointestinal: Negative for nausea,  vomiting, abdominal pain and diarrhea.  Musculoskeletal: Positive for neck pain.  Neurological: Positive for light-headedness and headaches. Negative for dizziness and weakness.    Social History  Substance Use Topics  . Smoking status: Former Smoker -- 1.00 packs/day for 7 years    Types: Cigarettes    Quit date: 03/20/1986  . Smokeless tobacco: Not on file  . Alcohol Use: 0.0 oz/week    0 Standard drinks or equivalent per week     Comment: 1-2 drinks der year   Objective:   BP 112/72 mmHg  Pulse 79  Temp(Src) 97.8 F (36.6 C) (Oral)  Resp 16  Wt 165 lb (74.844 kg)  SpO2 99%  LMP 03/16/2015  Physical Exam  General Appearance:    Alert, cooperative, no distress  HENT:   bilateral TM normal without fluid or infection, neck has bilateral anterior cervical nodes enlarged, sinuses nontender, post nasal drip noted and nasal mucosa pale and congested  Eyes:    PERRL, conjunctiva/corneas clear, EOM's intact       Lungs:     Clear to auscultation bilaterally, respirations unlabored  Heart:    Regular rate and rhythm  Neurologic:   Awake, alert, oriented x 3. No apparent focal neurological           defect.        Rapid strep test negative      Assessment & Plan:     1. Upper respiratory infection Counseled regarding signs and symptoms of viral and bacterial respiratory infections. Advised  to call or return for additional evaluation if he develops any sign of bacterial infection, or if current symptoms last longer than 10 days.    2. Sore throat Salt water gargles, ibuprofen per label, OTC Mucinex - POCT rapid strep A        Lelon Huh, MD  [facility]

## 2015-07-20 ENCOUNTER — Ambulatory Visit: Payer: BC Managed Care – PPO | Admitting: Family Medicine

## 2015-07-22 ENCOUNTER — Other Ambulatory Visit: Payer: Self-pay | Admitting: Family Medicine

## 2015-07-23 NOTE — Telephone Encounter (Signed)
Please call in alprazolam.  

## 2015-07-23 NOTE — Telephone Encounter (Signed)
Rx called in to pharmacy. 

## 2015-07-26 ENCOUNTER — Other Ambulatory Visit: Payer: Self-pay | Admitting: Family Medicine

## 2015-07-26 NOTE — Telephone Encounter (Signed)
Please call in alprazolam.  

## 2015-08-23 ENCOUNTER — Ambulatory Visit (INDEPENDENT_AMBULATORY_CARE_PROVIDER_SITE_OTHER): Payer: BC Managed Care – PPO | Admitting: Family Medicine

## 2015-08-23 ENCOUNTER — Encounter: Payer: Self-pay | Admitting: Family Medicine

## 2015-08-23 VITALS — BP 120/80 | HR 70 | Temp 98.0°F | Resp 16 | Ht 63.0 in | Wt 163.0 lb

## 2015-08-23 DIAGNOSIS — S30861A Insect bite (nonvenomous) of abdominal wall, initial encounter: Secondary | ICD-10-CM

## 2015-08-23 DIAGNOSIS — W57XXXA Bitten or stung by nonvenomous insect and other nonvenomous arthropods, initial encounter: Secondary | ICD-10-CM | POA: Diagnosis not present

## 2015-08-23 MED ORDER — DOXYCYCLINE HYCLATE 100 MG PO TABS: 100.0000 mg | ORAL_TABLET | Freq: Two times a day (BID) | ORAL | Status: AC

## 2015-08-23 NOTE — Progress Notes (Signed)
       Patient: Lisa Simmons Female    DOB: 1960/08/31   55 y.o.   MRN: [id number] Visit Date: 08/23/2015  Today's Provider: Lelon Huh, MD   Chief Complaint  Patient presents with  . Insect Bite   Subjective:    HPI   Patient found a small tick on her left hip 08/20/2015. Patient removed tick but skin has gotten redder daily with heat and tenderness. She has felt unusually tired today, and is starting to feels aches and tingling in her right arm. No fevers or chills.   Allergies  Allergen Reactions  . Avelox  [Moxifloxacin Hcl In Nacl]     Other reaction(s): Rapid pulse  . Iodine     Other reaction(s): Hives  . Penicillin G Rash   Previous Medications   ALPRAZOLAM (XANAX) 0.25 MG TABLET    TAKE 1 TABLET BY MOUTH AT BEDTIME AS NEEDED   LEVOTHYROXINE (SYNTHROID, LEVOTHROID) 100 MCG TABLET    Take 1 tablet by mouth daily. Reported on 03/16/2015   SERTRALINE (ZOLOFT) 50 MG TABLET    TAKE 1 TABLET BY MOUTH EVERY DAY    Review of Systems  Constitutional: Negative for fever, chills, appetite change and fatigue.  Respiratory: Negative for chest tightness and shortness of breath.   Cardiovascular: Negative for chest pain and palpitations.  Gastrointestinal: Negative for nausea, vomiting and abdominal pain.  Neurological: Negative for dizziness and weakness.    Social History  Substance Use Topics  . Smoking status: Former Smoker -- 1.00 packs/day for 7 years    Types: Cigarettes    Quit date: 03/20/1986  . Smokeless tobacco: Not on file  . Alcohol Use: 0.0 oz/week    0 Standard drinks or equivalent per week     Comment: 1-2 drinks der year   Objective:   BP 120/80 mmHg  Pulse 70  Temp(Src) 98 F (36.7 C) (Oral)  Resp 16  Ht 5\' 3"  (1.6 m)  Wt 163 lb (73.936 kg)  BMI 28.88 kg/m2  LMP 03/16/2015  Physical Exam   General Appearance:    Alert, cooperative, no distress  Eyes:    PERRL, conjunctiva/corneas clear, EOM's intact       Lungs:     Clear to  auscultation bilaterally, respirations unlabored  Heart:    Regular rate and rhythm  Neurologic:   Awake, alert, oriented x 3. No apparent focal neurological           defect.   Skin:  About 2cm round area of confluent erythema around small papule at site of tick bite right lateral hip.         Assessment & Plan:     1. Tick bite of abdomen, initial encounter  - doxycycline (VIBRA-TABS) 100 MG tablet; Take 1 tablet (100 mg total) by mouth 2 (two) times daily.  Dispense: 14 tablet; Refill: 0      The entirety of the information documented in the History of Present Illness, Review of Systems and Physical Exam were personally obtained by me. Portions of this information were initially documented by April M. Sabra Heck, CMA and reviewed by me for thoroughness and accuracy.    Lelon Huh, MD  [facility]

## 2015-11-30 ENCOUNTER — Other Ambulatory Visit: Payer: Self-pay | Admitting: *Deleted

## 2015-11-30 MED ORDER — SERTRALINE HCL 50 MG PO TABS: 50.0000 mg | ORAL_TABLET | Freq: Every day | ORAL | 1 refills | Status: DC

## 2015-11-30 NOTE — Telephone Encounter (Signed)
Requesting 90 day supply.

## 2016-03-06 ENCOUNTER — Ambulatory Visit (INDEPENDENT_AMBULATORY_CARE_PROVIDER_SITE_OTHER): Payer: BC Managed Care – PPO | Admitting: Family Medicine

## 2016-03-06 ENCOUNTER — Encounter: Payer: Self-pay | Admitting: Family Medicine

## 2016-03-06 VITALS — BP 140/88 | HR 88 | Temp 99.0°F | Resp 16 | Ht 63.5 in | Wt 169.0 lb

## 2016-03-06 DIAGNOSIS — R58 Hemorrhage, not elsewhere classified: Secondary | ICD-10-CM | POA: Diagnosis not present

## 2016-03-06 DIAGNOSIS — F419 Anxiety disorder, unspecified: Secondary | ICD-10-CM | POA: Diagnosis not present

## 2016-03-06 DIAGNOSIS — Z1159 Encounter for screening for other viral diseases: Secondary | ICD-10-CM | POA: Diagnosis not present

## 2016-03-06 DIAGNOSIS — E559 Vitamin D deficiency, unspecified: Secondary | ICD-10-CM

## 2016-03-06 DIAGNOSIS — E890 Postprocedural hypothyroidism: Secondary | ICD-10-CM | POA: Diagnosis not present

## 2016-03-06 DIAGNOSIS — Z0000 Encounter for general adult medical examination without abnormal findings: Secondary | ICD-10-CM

## 2016-03-06 NOTE — Progress Notes (Signed)
Patient: Lisa Simmons, Female    DOB: March 02, 1961, 55 y.o.   MRN: [id number] Visit Date: 03/06/2016  Today's Provider: Lelon Huh, MD   Chief Complaint  Patient presents with  . Annual Exam  . Hyperlipidemia    follow up   Subjective:    Annual physical exam Lisa Simmons is a 55 y.o. female who presents today for health maintenance and complete physical. She feels poorly. She reports never exercising. She reports she is sleeping poorly.  -----------------------------------------------------------------  Lipid/Cholesterol, Follow-up:   Last seen for this1 years ago.  Management changes since that visit include counseling patient to avoid saturated fats in her diet. . Last Lipid Panel:    Component Value Date/Time   CHOL 212 (H) 03/17/2015 1146   TRIG 100 03/17/2015 1146   HDL 61 03/17/2015 1146   CHOLHDL 3.5 03/17/2015 1146   LDLCALC 131 (H) 03/17/2015 1146    Risk factors for vascular disease include hypercholesterolemia  She reports good compliance with treatment. She is not having side effects.  Current symptoms include none and have been stable. Weight trend: increasing steadily Prior visit with dietician: no Current diet: well balanced Current exercise: none  Wt Readings from Last 3 Encounters:  08/23/15 163 lb (73.9 kg)  07/09/15 165 lb (74.8 kg)  04/21/15 166 lb (75.3 kg)    -------------------------------------------------------------------  Follow up Anxiety:  Current treatment includes Xanax and Zoloft. Patient reports good compliance with treatment, good tolerance and good symptom control.    Follow up Vitamin D Deficiency: Patient was last seen for this problem 1 year ago and was started on OTC Vitamin D 3 2,000 units daily. Patient comes in today reporting that she stopped taking Vitamin D several months ago.   She reports small discolored lesions that she thinks are echymosis.   Review of Systems  Constitutional: Negative  for chills, fatigue and fever.  HENT: Positive for congestion, mouth sores, rhinorrhea, sinus pressure, sneezing and voice change. Negative for ear pain and sore throat.   Eyes: Negative.  Negative for pain and redness.  Respiratory: Positive for cough and shortness of breath. Negative for wheezing.   Cardiovascular: Negative for chest pain and leg swelling.  Gastrointestinal: Negative for abdominal pain, blood in stool, constipation, diarrhea and nausea.  Endocrine: Positive for cold intolerance and heat intolerance. Negative for polydipsia and polyphagia.  Genitourinary: Positive for vaginal pain. Negative for dysuria, flank pain, hematuria, pelvic pain, vaginal bleeding and vaginal discharge.  Musculoskeletal: Positive for arthralgias and back pain. Negative for gait problem and joint swelling.  Skin: Negative for rash.  Neurological: Positive for syncope and light-headedness. Negative for dizziness, tremors, seizures, weakness, numbness and headaches.  Hematological: Negative for adenopathy. Bruises/bleeds easily (around eye and neck).  Psychiatric/Behavioral: Positive for sleep disturbance. Negative for behavioral problems, confusion and dysphoric mood. The patient is not nervous/anxious and is not hyperactive.     Social History      She  reports that she quit smoking about 29 years ago. Her smoking use included Cigarettes. She has a 7.00 pack-year smoking history. She does not have any smokeless tobacco history on file. She reports that she drinks alcohol. She reports that she does not use drugs.       Social History   Social History  . Marital status: Married    Spouse name: N/A  . Number of children: 3  . Years of education: N/A   Occupational History  . Teacher  Social History Main Topics  . Smoking status: Former Smoker    Packs/day: 1.00    Years: 7.00    Types: Cigarettes    Quit date: 03/20/1986  . Smokeless tobacco: Never Used  . Alcohol use 0.0 oz/week      Comment: 1-2 drinks der year  . Drug use: No  . Sexual activity: Not Asked   Other Topics Concern  . None   Social History Narrative  . None    Past Medical History:  Diagnosis Date  . History of chicken pox   . History of measles   . History of mumps      Patient Active Problem List   Diagnosis Date Noted  . Irritable bowel syndrome 05/07/2015  . History of allergy 03/11/2015  . Anxiety 03/11/2015  . Arthritis 03/11/2015  . Chronic headache 03/11/2015  . Hypothyroidism, postop 03/11/2015  . Insomnia 03/11/2015  . Menopausal symptom 03/11/2015  . Osteopenia 03/11/2015  . Vitamin D deficiency 03/17/2011    Past Surgical History:  Procedure Laterality Date  . ABDOMINAL HYSTERECTOMY  2003   due to Endometriosis and severe Anemiaabdominal; Laproscopic; still has ovaries and cervix  . CESAREAN SECTION  1989  . [place]   x2  . THYROIDECTOMY  06/2012   Dr. Jerral Bonito; due to multinodular goiter  . TONSILLECTOMY  1970    Family History        Family Status  Relation Status  . Mother Alive  . Father Deceased   Cause of Death: overdose; substance abuse  . Sister Alive   substance abuse  . Maternal Grandfather Deceased  . Paternal Grandfather Deceased  . Sister Alive  . Sister Alive  . Paternal Grandmother Deceased  . Maternal Grandmother         Her family history includes Alcohol abuse in her sister; Depression in her father; Heart disease in her father, maternal grandfather, maternal grandmother, paternal grandfather, and paternal grandmother; Hypothyroidism in her mother; Stroke in her maternal grandfather.     Allergies  Allergen Reactions  . Avelox  [Moxifloxacin Hcl In Nacl]     Other reaction(s): Rapid pulse  . Iodine     Other reaction(s): Hives  . Penicillin G Rash     Current Outpatient Prescriptions:  .  ALPRAZolam (XANAX) 0.25 MG tablet, TAKE 1 TABLET BY MOUTH AT BEDTIME AS NEEDED, Disp: 30 tablet, Rfl: 2 .   levothyroxine (SYNTHROID, LEVOTHROID) 100 MCG tablet, Take 1 tablet by mouth daily. Reported on 03/16/2015, Disp: , Rfl:  .  sertraline (ZOLOFT) 50 MG tablet, Take 1 tablet (50 mg total) by mouth daily., Disp: 90 tablet, Rfl: 1   Patient Care Team: Birdie Sons, MD as PCP - General (Family Medicine) Margaretha Sheffield, MD (Otolaryngology)      Objective:   Vitals: BP 140/88 (BP Location: Left Arm, Patient Position: Sitting, Cuff Size: Normal)   Pulse 88   Temp 99 F (37.2 C) (Oral)   Resp 16   Ht 5' 3.5" (1.613 m)   Wt 169 lb (76.7 kg)   LMP 03/16/2015   SpO2 96% Comment: room air  BMI 29.47 kg/m    Physical Exam    General Appearance:    Alert, cooperative, no distress, appears stated age  Head:    Normocephalic, without obvious abnormality, atraumatic  Eyes:    PERRL, conjunctiva/corneas clear, EOM's intact, fundi    benign, both eyes  Ears:    Normal TM's and external  ear canals, both ears  Nose:   Nares normal, septum midline, mucosa normal, no drainage    or sinus tenderness  Throat:   Lips, mucosa, and tongue normal; teeth and gums normal  Neck:   Supple, symmetrical, trachea midline, no adenopathy;    thyroid:  no enlargement/tenderness/nodules; no carotid   bruit or JVD  Back:     Symmetric, no curvature, ROM normal, no CVA tenderness  Lungs:     Clear to auscultation bilaterally, respirations unlabored  Chest Wall:    No tenderness or deformity   Heart:    Regular rate and rhythm, S1 and S2 normal, no murmur, rub   or gallop  Breast Exam:    normal appearance, no masses or tenderness  Abdomen:     Soft, non-tender, bowel sounds active all four quadrants,    no masses, no organomegaly  Pelvic:    deferred  Extremities:   Extremities normal, atraumatic, no cyanosis or edema  Pulses:   2+ and symmetric all extremities  Skin:   Skin color, texture, turgor normal, no rashes or lesions  Lymph nodes:   Cervical, supraclavicular, and axillary nodes normal  Neurologic:    CNII-XII intact, normal strength, sensation and reflexes    throughout     Depression Screen PHQ 2/9 Scores 03/06/2016 03/16/2015  PHQ - 2 Score 0 0  PHQ- 9 Score 6 5      Assessment & Plan:     Routine Health Maintenance and Physical Exam  Exercise Activities and Dietary recommendations Goals    None      Immunization History  Administered Date(s) Administered  . Influenza-Unspecified 02/02/2015  . Tdap 03/10/2013    Health Maintenance  Topic Date Due  . Hepatitis C Screening  Mar 02, 1961  . HIV Screening  06/18/1977  . COLONOSCOPY  06/18/2012  . INFLUENZA VACCINE  10/19/2015  . MAMMOGRAM  03/16/2017  . TETANUS/TDAP  03/11/2023     Discussed health benefits of physical activity, and encouraged her to engage in regular exercise appropriate for her age and condition.    -------------------------------------------------------------------- 1. Annual physical exam  - CBC - Comprehensive metabolic panel - Lipid panel - EKG 12-Lead  2. Hypothyroidism, postop  - T4 AND TSH  3. Vitamin D deficiency  - VITAMIN D 25 Hydroxy (Vit-D Deficiency, Fractures)  4. Anxiety   5. Need for hepatitis C screening test   6. Ecchymosis  - PTT - Protime-INR  The entirety of the information documented in the History of Present Illness, Review of Systems and Physical Exam were personally obtained by me. Portions of this information were initially documented by Meyer Cory, CMA and reviewed by me for thoroughness and accuracy.    Lelon Huh, MD  [facility]

## 2016-03-11 LAB — COMPREHENSIVE METABOLIC PANEL
A/G RATIO: 2.2 (ref 1.2–2.2)
ALBUMIN: 4.6 g/dL (ref 3.5–5.5)
ALK PHOS: 89 IU/L (ref 39–117)
ALT: 18 IU/L (ref 0–32)
AST: 15 IU/L (ref 0–40)
BILIRUBIN TOTAL: 0.4 mg/dL (ref 0.0–1.2)
BUN / CREAT RATIO: 11 (ref 9–23)
BUN: 8 mg/dL (ref 6–24)
CHLORIDE: 99 mmol/L (ref 96–106)
CO2: 25 mmol/L (ref 18–29)
Calcium: 8.8 mg/dL (ref 8.7–10.2)
Creatinine, Ser: 0.76 mg/dL (ref 0.57–1.00)
GFR calc Af Amer: 104 mL/min/{1.73_m2} (ref >59)
GFR calc non Af Amer: 90 mL/min/{1.73_m2} (ref >59)
GLOBULIN, TOTAL: 2.1 g/dL (ref 1.5–4.5)
Glucose: 103 mg/dL — ABNORMAL HIGH (ref 65–99)
POTASSIUM: 3.6 mmol/L (ref 3.5–5.2)
SODIUM: 142 mmol/L (ref 134–144)
Total Protein: 6.7 g/dL (ref 6.0–8.5)

## 2016-03-11 LAB — LIPID PANEL
CHOLESTEROL TOTAL: 171 mg/dL (ref 100–199)
Chol/HDL Ratio: 3.4 ratio units (ref 0.0–4.4)
HDL: 50 mg/dL (ref >39)
LDL CALC: 106 mg/dL — AB (ref 0–99)
TRIGLYCERIDES: 76 mg/dL (ref 0–149)
VLDL Cholesterol Cal: 15 mg/dL (ref 5–40)

## 2016-03-11 LAB — PROTIME-INR
INR: 1 (ref 0.8–1.2)
Prothrombin Time: 10.2 s (ref 9.1–12.0)

## 2016-03-11 LAB — CBC
HEMATOCRIT: 37.2 % (ref 34.0–46.6)
HEMOGLOBIN: 12.7 g/dL (ref 11.1–15.9)
MCH: 31.1 pg (ref 26.6–33.0)
MCHC: 34.1 g/dL (ref 31.5–35.7)
MCV: 91 fL (ref 79–97)
Platelets: 256 10*3/uL (ref 150–379)
RBC: 4.08 x10E6/uL (ref 3.77–5.28)
RDW: 12.9 % (ref 12.3–15.4)
WBC: 6.1 10*3/uL (ref 3.4–10.8)

## 2016-03-11 LAB — T4 AND TSH
T4 TOTAL: 8.8 ug/dL (ref 4.5–12.0)
TSH: 1.37 u[IU]/mL (ref 0.450–4.500)

## 2016-03-11 LAB — VITAMIN D 25 HYDROXY (VIT D DEFICIENCY, FRACTURES): Vit D, 25-Hydroxy: 19.2 ng/mL — ABNORMAL LOW (ref 30.0–100.0)

## 2016-03-11 LAB — APTT: APTT: 26 s (ref 24–33)

## 2016-03-29 ENCOUNTER — Other Ambulatory Visit: Payer: Self-pay | Admitting: Family Medicine

## 2016-03-29 DIAGNOSIS — Z1231 Encounter for screening mammogram for malignant neoplasm of breast: Secondary | ICD-10-CM

## 2016-04-24 ENCOUNTER — Ambulatory Visit: Payer: BC Managed Care – PPO

## 2016-04-24 ENCOUNTER — Ambulatory Visit
Admission: RE | Admit: 2016-04-24 | Discharge: 2016-04-24 | Disposition: A | Discharge status: Home / Self Care | Payer: BC Managed Care – PPO | Source: Ambulatory Visit | Attending: Family Medicine | Admitting: Family Medicine

## 2016-04-24 DIAGNOSIS — Z1231 Encounter for screening mammogram for malignant neoplasm of breast: Secondary | ICD-10-CM | POA: Diagnosis not present

## 2016-06-21 ENCOUNTER — Other Ambulatory Visit: Payer: Self-pay | Admitting: Family Medicine

## 2016-06-21 NOTE — Telephone Encounter (Signed)
Refill request for sertraline 50 mg qd Last filled by MD on- 11/30/2015 #90 x1 refill Last Appt: 03/06/2016 Next Appt: none Please advise refill?

## 2016-08-28 ENCOUNTER — Telehealth: Payer: Self-pay | Admitting: Family Medicine

## 2016-08-28 NOTE — Telephone Encounter (Signed)
Please advise 

## 2016-08-28 NOTE — Telephone Encounter (Signed)
Pt is requesting Prilosec sent to [facility].She states this has been discussed with you in a past appointment.She is not benefiting from anything over the counter any longer.She will also be taking a trip in which she will have to take a plane,she is requesting scopalomine patches for nausea

## 2016-08-30 MED ORDER — OMEPRAZOLE 40 MG PO CPDR: 40.0000 mg | DELAYED_RELEASE_CAPSULE | Freq: Every day | ORAL | 5 refills | Status: DC

## 2016-09-05 MED ORDER — SCOPOLAMINE 1 MG/3DAYS TD PT72: 1.0000 | MEDICATED_PATCH | TRANSDERMAL | 0 refills | Status: DC

## 2016-09-05 MED ORDER — ALPRAZOLAM 0.25 MG PO TABS: 0.2500 mg | ORAL_TABLET | Freq: Every evening | ORAL | 2 refills | Status: DC | PRN

## 2016-09-05 NOTE — Addendum Note (Signed)
Addended by: Birdie Sons on: 09/05/2016 05:42 PM   Modules accepted: Orders

## 2016-09-05 NOTE — Telephone Encounter (Signed)
Please call in alprazolam and scopolamine patch.

## 2016-09-05 NOTE — Telephone Encounter (Signed)
She will be taking a trip in which she will have to take a plane,she is requesting scopalomine patches for nausea.  ALPRAZolam (XANAX) 0.25 MG tablet.      [facility]  CB#[id number]

## 2016-09-05 NOTE — Telephone Encounter (Signed)
Please advise 

## 2016-09-06 NOTE — Telephone Encounter (Signed)
Rx's called into pharmacy.

## 2016-12-18 ENCOUNTER — Encounter: Payer: Self-pay | Admitting: Family Medicine

## 2016-12-18 ENCOUNTER — Ambulatory Visit (INDEPENDENT_AMBULATORY_CARE_PROVIDER_SITE_OTHER): Payer: BC Managed Care – PPO | Admitting: Family Medicine

## 2016-12-18 VITALS — BP 122/80 | HR 85 | Temp 98.4°F | Resp 16 | Wt 172.0 lb

## 2016-12-18 DIAGNOSIS — R319 Hematuria, unspecified: Secondary | ICD-10-CM

## 2016-12-18 DIAGNOSIS — N390 Urinary tract infection, site not specified: Secondary | ICD-10-CM

## 2016-12-18 LAB — POCT URINALYSIS DIPSTICK
Glucose, UA: NEGATIVE
Ketones, UA: NEGATIVE
NITRITE UA: NEGATIVE
PH UA: 6 (ref 5.0–8.0)
PROTEIN UA: NEGATIVE
Spec Grav, UA: 1.02 (ref 1.010–1.025)
UROBILINOGEN UA: 0.2 U/dL

## 2016-12-18 MED ORDER — METRONIDAZOLE 500 MG PO TABS: 500.0000 mg | ORAL_TABLET | Freq: Two times a day (BID) | ORAL | 0 refills | Status: DC

## 2016-12-18 MED ORDER — CIPROFLOXACIN HCL 500 MG PO TABS: 500.0000 mg | ORAL_TABLET | Freq: Two times a day (BID) | ORAL | 0 refills | Status: AC

## 2016-12-18 NOTE — Progress Notes (Signed)
Patient: Lisa Simmons Female    DOB: [birth year]   56 y.o.   MRN: [id number] Visit Date: 12/18/2016  Today's Provider: Lelon Huh, MD   Chief Complaint  Patient presents with  . Abdominal Pain   Subjective:    Abdominal Pain  This is a new problem. Episode onset: 2 days ago. The onset quality is sudden (woke up Saturday morning with abdominal pain). The problem occurs intermittently. The pain is located in the LLQ. The pain is moderate. The quality of the pain is sharp (pain has now become tender). Associated symptoms include diarrhea, a fever (low grade; 99.8) and nausea. Pertinent negatives include no constipation, dysuria, frequency, headaches, melena or vomiting. Treatments tried: Gas X. The treatment provided no relief.  Patient states she had a similar episode of abdominal pain a few weeks ago, and it last 2 days then resolved. Patient reports the current episode is worse that the first. Pain is not affected by eating. No relief with BM. Is moderate to sever in intensity.       Allergies  Allergen Reactions  . Avelox  [Moxifloxacin Hcl In Nacl]     Other reaction(s): Rapid pulse  . Iodine     Other reaction(s): Hives  . Penicillin G Rash     Current Outpatient Prescriptions:  .  ALPRAZolam (XANAX) 0.25 MG tablet, Take 1 tablet (0.25 mg total) by mouth at bedtime as needed., Disp: 30 tablet, Rfl: 2 .  levothyroxine (SYNTHROID, LEVOTHROID) 100 MCG tablet, Take 1 tablet by mouth daily. Reported on 03/16/2015, Disp: , Rfl:  .  omeprazole (PRILOSEC) 40 MG capsule, Take 1 capsule (40 mg total) by mouth daily., Disp: 30 capsule, Rfl: 5 .  sertraline (ZOLOFT) 50 MG tablet, TAKE 1 TABLET (50 MG TOTAL) BY MOUTH DAILY., Disp: 90 tablet, Rfl: 1  Review of Systems  Constitutional: Positive for chills and fever (low grade; 99.8). Negative for appetite change, diaphoresis and fatigue.  Respiratory: Negative for chest tightness and shortness of breath.   Cardiovascular:  Negative for chest pain and palpitations.  Gastrointestinal: Positive for abdominal distention, abdominal pain, diarrhea and nausea. Negative for constipation, melena and vomiting.  Genitourinary: Negative for dysuria and frequency.  Neurological: Negative for dizziness, weakness and headaches.    Social History  Substance Use Topics  . Smoking status: Former Smoker    Packs/day: 1.00    Years: 7.00    Types: Cigarettes    Quit date: 03/20/1986  . Smokeless tobacco: Never Used  . Alcohol use 0.0 oz/week     Comment: 1-2 drinks der year   Objective:   BP 122/80 (BP Location: Right Arm, Patient Position: Sitting, Cuff Size: Normal)   Pulse 85   Temp 98.4 F (36.9 C) (Oral)   Resp 16   Wt 172 lb (78 kg)   LMP 03/16/2015   SpO2 97% Comment: room air  BMI 29.99 kg/m  There were no vitals filed for this visit.   Physical Exam  General Appearance:    Alert, cooperative, no distress  Eyes:    PERRL, conjunctiva/corneas clear, EOM's intact       Lungs:     Clear to auscultation bilaterally, respirations unlabored  Heart:    Regular rate and rhythm  Abdomen:   bowel sounds present and normal in all 4 quadrants, soft or round. Diffuse left sided tenderness into left suprapubic area. Left CVA tenderness    Results for orders placed or performed in visit on  12/18/16  Urine Culture  POCT Urinalysis Dipstick  Result Value Ref Range   Color, UA amber    Clarity, UA clear    Glucose, UA negative    Bilirubin, UA small (+)    Ketones, UA negative    Spec Grav, UA 1.020 1.010 - 1.025   Blood, UA Trace (Hemolyzed)    pH, UA 6.0 5.0 - 8.0   Protein, UA negative    Urobilinogen, UA 0.2 0.2 or 1.0 E.U./dL   Nitrite, UA negative    Leukocytes, UA Moderate (2+) (A) Negative       Assessment & Plan:     1. Urinary tract infection with hematuria, site unspecified Cover for UTI and diverticulitis while awaiting cultures. Consider abdominal imaging if not rapidly improving.  - POCT  Urinalysis Dipstick - Urine Culture - ciprofloxacin (CIPRO) 500 MG tablet; Take 1 tablet (500 mg total) by mouth 2 (two) times daily.  Dispense: 20 tablet; Refill: 0 - metroNIDAZOLE (FLAGYL) 500 MG tablet; Take 1 tablet (500 mg total) by mouth 2 (two) times daily.  Dispense: 20 tablet; Refill: 0       Lelon Huh, MD  [facility]

## 2016-12-19 ENCOUNTER — Other Ambulatory Visit: Payer: Self-pay | Admitting: Physician Assistant

## 2016-12-19 NOTE — Telephone Encounter (Signed)
Pharmacy requesting refills. Please review. Thanks!

## 2016-12-20 ENCOUNTER — Other Ambulatory Visit: Payer: Self-pay

## 2016-12-20 ENCOUNTER — Telehealth: Payer: Self-pay

## 2016-12-20 DIAGNOSIS — R1032 Left lower quadrant pain: Secondary | ICD-10-CM

## 2016-12-20 LAB — URINE CULTURE
MICRO NUMBER:: 81085724
SPECIMEN QUALITY:: ADEQUATE

## 2016-12-20 NOTE — Telephone Encounter (Signed)
Sarah advised. She will fax order today.

## 2016-12-20 NOTE — Telephone Encounter (Signed)
Patient advised. She states the pain is about the same as before starting the antibiotic. Patient requested to proceed with CT scan. Patient states she is allergic to contrast. Dr. Caryn Section advised and approved CT scan WO contrast.CT ordered.

## 2016-12-20 NOTE — Telephone Encounter (Signed)
-----   Message from Birdie Sons, MD sent at 12/20/2016  8:04 AM EDT ----- Urine culture does not show any specific type of infection. If doing better then finish antibiotic, if not doing better then need to order CT abdomen and pelvis with contrast for left side abdominal pain. (Rule out stone, rule out diverticulitis)

## 2016-12-20 NOTE — Telephone Encounter (Signed)
Lisa Simmons with [facility] is requesting a hard copy order of CT abdomen pelvis without contrast to be faxed to [phone]. She states this was ordered by Dr. Caryn Section.

## 2016-12-25 ENCOUNTER — Telehealth: Payer: Self-pay | Admitting: Family Medicine

## 2016-12-25 NOTE — Telephone Encounter (Signed)
LMOVM for pt to return call 

## 2016-12-25 NOTE — Telephone Encounter (Signed)
Please advise patient that CT shows mild diverticulitis. There were no kidney stones. Diverticulitis should resolved with antibiotics that were prescribed. Please call if not feeling much better by the time she is finished with antibiotics

## 2016-12-27 NOTE — Telephone Encounter (Signed)
Patient advised.

## 2016-12-27 NOTE — Telephone Encounter (Signed)
Pt is returning call.  CB#[phone]/MW

## 2016-12-27 NOTE — Telephone Encounter (Signed)
LMOVM for pt to return call 

## 2017-02-20 ENCOUNTER — Other Ambulatory Visit: Payer: Self-pay | Admitting: Family Medicine

## 2017-02-20 MED ORDER — OMEPRAZOLE 40 MG PO CPDR: 40.0000 mg | DELAYED_RELEASE_CAPSULE | Freq: Every day | ORAL | 3 refills | Status: DC

## 2017-02-20 NOTE — Telephone Encounter (Signed)
[facility]ill request for a 90-days supply for the following medication. Thanks CC ° °omeprazole (PRILOSEC) 40 MG capsule  ° °

## 2017-03-14 NOTE — Progress Notes (Signed)
Patient: Lisa Simmons, Female    DOB: 12-31-1960, 56 y.o.   MRN: [id number] Visit Date: 03/14/2017  Today's Provider: Lelon Huh, MD   Chief Complaint  Patient presents with  . Annual Exam  . Hypothyroidism    follow up  . Anxiety    follow up   Subjective:    Annual physical exam DANIKA KLUENDER is a 56 y.o. female who presents today for health maintenance and complete physical. She feels well. She reports never exercising. She reports she is sleeping poorly (has trouble staying asleep).  -----------------------------------------------------------------  Hypothyroidism, postop From 03/06/2016- no changes. Patient reports good compliance with treatment, and good tolerance. No change in energy levels.  Lab Results  Component Value Date   TSH 1.370 03/10/2016   Wt Readings from Last 3 Encounters:  03/15/17 174 lb (78.9 kg)  12/18/16 172 lb (78 kg)  03/06/16 169 lb (76.7 kg)      Vitamin D deficiency From 03/06/2016- Was not taking supplements at that time. Advised to start 2000 units a day after checking levels as below.  Lab Results  Component Value Date   VD25OH 19.2 (L) 03/10/2016     Anxiety From 03/06/2016-no changes. Patient reports good compliance with treatment, and good tolerance. She doesn't think she needs sertraline anymore for anxiety, but hasn't had a migraine ever since she has been taking it.   She also complains or pain and swelling in the back of her left heel for the last couple of month. No known injury. Not taken anything for it.   Also follow up GERD. Is taking omeprazole every day which she finds effective.    Review of Systems  Constitutional: Negative for chills, fatigue and fever.  HENT: Negative for congestion, ear pain, rhinorrhea, sneezing and sore throat.   Eyes: Negative.  Negative for pain and redness.  Respiratory: Negative for cough, shortness of breath and wheezing.   Cardiovascular: Negative for chest pain and  leg swelling.  Gastrointestinal: Negative for abdominal pain, blood in stool, constipation, diarrhea and nausea.  Endocrine: Negative for polydipsia and polyphagia.  Genitourinary: Negative.  Negative for dysuria, flank pain, hematuria, pelvic pain, vaginal bleeding and vaginal discharge.       Pain during sex  Musculoskeletal: Negative for arthralgias, back pain, gait problem and joint swelling.       Pain of right foot  Skin: Negative for rash.  Allergic/Immunologic: Positive for environmental allergies.  Neurological: Negative.  Negative for dizziness, tremors, seizures, weakness, light-headedness, numbness and headaches.  Hematological: Negative for adenopathy.  Psychiatric/Behavioral: Negative.  Negative for behavioral problems, confusion and dysphoric mood. The patient is not nervous/anxious and is not hyperactive.     Social History      She  reports that she quit smoking about 31 years ago. Her smoking use included cigarettes. She has a 7.00 pack-year smoking history. she has never used smokeless tobacco. She reports that she drinks alcohol. She reports that she does not use drugs.       Social History   Socioeconomic History  . Marital status: Married    Spouse name: None  . Number of children: 3  . Years of education: None  . Highest education level: None  Social Needs  . Financial resource strain: None  . Food insecurity - worry: None  . Food insecurity - inability: None  . Transportation needs - medical: None  . Transportation needs - non-medical: None  Occupational History  .  Occupation: Pharmacist, hospital  Tobacco Use  . Smoking status: Former Smoker    Packs/day: 1.00    Years: 7.00    Pack years: 7.00    Types: Cigarettes    Last attempt to quit: 03/20/1986    Years since quitting: 31.0  . Smokeless tobacco: Never Used  Substance and Sexual Activity  . Alcohol use: Yes    Alcohol/week: 0.0 oz    Comment: 1-2 drinks per year  . Drug use: No  . Sexual activity: None    Other Topics Concern  . None  Social History Narrative  . None    Past Medical History:  Diagnosis Date  . History of chicken pox   . History of measles   . History of mumps      Patient Active Problem List   Diagnosis Date Noted  . Irritable bowel syndrome 05/07/2015  . History of allergy 03/11/2015  . Anxiety 03/11/2015  . Arthritis 03/11/2015  . Chronic headache 03/11/2015  . Hypothyroidism, postop 03/11/2015  . Insomnia 03/11/2015  . Menopausal symptom 03/11/2015  . Osteopenia 03/11/2015  . Vitamin D deficiency 03/17/2011    Past Surgical History:  Procedure Laterality Date  . ABDOMINAL HYSTERECTOMY  2003   due to Endometriosis and severe Anemiaabdominal; Laproscopic; still has ovaries and cervix  . CESAREAN SECTION  1989  . [place]   x2  . THYROIDECTOMY  06/2012   Dr. Jerral Bonito; due to multinodular goiter  . TONSILLECTOMY  1970    Family History        Family Status  Relation Name Status  . Mother  Alive  . Father  Deceased       Cause of Death: overdose; substance abuse  . Sister half sister Alive       substance abuse  . MGF  Deceased  . PGF  Deceased  . Sister  Alive  . Sister  Alive  . PGM  Deceased  . MGM  (Not Specified)        Her family history includes Alcohol abuse in her sister; Depression in her father; Diabetes in her mother; Heart disease in her father, maternal grandfather, maternal grandmother, paternal grandfather, and paternal grandmother; Hypothyroidism in her mother; Stroke in her maternal grandfather.     Allergies  Allergen Reactions  . Avelox  [Moxifloxacin Hcl In Nacl]     Other reaction(s): Rapid pulse  . Iodine     Other reaction(s): Hives  . Penicillin G Rash     Current Outpatient Medications:  .  ALPRAZolam (XANAX) 0.25 MG tablet, Take 1 tablet (0.25 mg total) by mouth at bedtime as needed., Disp: 30 tablet, Rfl: 2 .  levothyroxine (SYNTHROID, LEVOTHROID) 100 MCG tablet, Take 1  tablet by mouth daily. Reported on 03/16/2015, Disp: , Rfl:  .  omeprazole (PRILOSEC) 40 MG capsule, Take 1 capsule (40 mg total) by mouth daily., Disp: 90 capsule, Rfl: 3 .  sertraline (ZOLOFT) 50 MG tablet, TAKE 1 TABLET (50 MG TOTAL) BY MOUTH DAILY., Disp: 90 tablet, Rfl: 3   Patient Care Team: Birdie Sons, MD as PCP - General (Family Medicine) Margaretha Sheffield, MD (Otolaryngology)      Objective:   Vitals: BP 128/82 (BP Location: Left Arm, Patient Position: Sitting, Cuff Size: Normal)   Pulse 82   Temp 97.9 F (36.6 C) (Oral)   Resp 18   Ht 5\' 3"  (1.6 m)   Wt 174 lb (78.9 kg)   LMP 03/16/2015  SpO2 98%   BMI 30.82 kg/m    Vitals:   03/15/17 1016  BP: 128/82  Pulse: 82  Resp: 18  Temp: 97.9 F (36.6 C)  TempSrc: Oral  SpO2: 98%  Weight: 174 lb (78.9 kg)  Height: 5\' 3"  (1.6 m)     Physical Exam   General Appearance:    Alert, cooperative, no distress, appears stated age  Head:    Normocephalic, without obvious abnormality, atraumatic  Eyes:    PERRL, conjunctiva/corneas clear, EOM's intact, fundi    benign, both eyes  Ears:    Normal TM's and external ear canals, both ears  Nose:   Nares normal, septum midline, mucosa normal, no drainage    or sinus tenderness  Throat:   Lips, mucosa, and tongue normal; teeth and gums normal  Neck:   Supple, symmetrical, trachea midline, no adenopathy;    thyroid:  no enlargement/tenderness/nodules; no carotid   bruit or JVD  Back:     Symmetric, no curvature, ROM normal, no CVA tenderness  Lungs:     Clear to auscultation bilaterally, respirations unlabored  Chest Wall:    No tenderness or deformity   Heart:    Regular rate and rhythm, S1 and S2 normal, no murmur, rub   or gallop  Breast Exam:    normal appearance, no masses or tenderness  Abdomen:     Soft, non-tender, bowel sounds active all four quadrants,    no masses, no organomegaly  Pelvic:    cervix normal in appearance, external genitalia normal and no adnexal  masses or tenderness  Extremities:   Extremities normal, atraumatic, no cyanosis or edema  Pulses:   2+ and symmetric all extremities  Skin:   Skin color, texture, turgor normal, no rashes or lesions  Lymph nodes:   Cervical, supraclavicular, and axillary nodes normal  Neurologic:   CNII-XII intact, normal strength, sensation and reflexes    throughout    Depression Screen PHQ 2/9 Scores 03/15/2017 03/06/2016 03/16/2015  PHQ - 2 Score 0 0 0  PHQ- 9 Score 5 6 5       Assessment & Plan:     Routine Health Maintenance and Physical Exam  Exercise Activities and Dietary recommendations Goals    None      Immunization History  Administered Date(s) Administered  . Influenza-Unspecified 02/02/2015  . Tdap 03/10/2013    Health Maintenance  Topic Date Due  . Hepatitis C Screening  1960/12/23  . HIV Screening  06/18/1977  . INFLUENZA VACCINE  10/18/2016  . Fecal DNA (Cologuard)  03/21/2018  . MAMMOGRAM  04/24/2018  . TETANUS/TDAP  03/11/2023     Discussed health benefits of physical activity, and encouraged her to engage in regular exercise appropriate for her age and condition.    --------------------------------------------------------------------  1. Annual physical exam Generally doing well. Increase exercise to lose 5-10 pounds.  - Lipid panel - Comprehensive metabolic panel  2. Disorder of left Achilles tendon  - naproxen (NAPROSYN) 500 MG tablet; Take 1 tablet (500 mg total) by mouth 2 (two) times daily with a meal.  Dispense: 30 tablet; Refill: 0  Instructions on stretching exercises, consider podiatry referral if not improving in 3-4 weeks.   3. Hypothyroidism, postop Doing well on current dose of levothyroxine.  - TSH  4. Chronic nonintractable headache, unspecified headache type No headaches since being on sertraline. She would like to get off of medication as she doesn't feel like she needs sertraline for anxiety anymore. Counseled on low  risk of long  term side effects. To reduce migraine recurrence she should taper very slowly, she can try reducing to 1/2 tablet daily for 5-6 months, then d/c if no migraines.   5. Vitamin D deficiency  - VITAMIN D 25 Hydroxy (Vit-D Deficiency, Fractures)   Lelon Huh, MD  [facility]

## 2017-03-15 ENCOUNTER — Ambulatory Visit: Payer: BC Managed Care – PPO | Admitting: Family Medicine

## 2017-03-15 ENCOUNTER — Encounter: Payer: Self-pay | Admitting: Family Medicine

## 2017-03-15 VITALS — BP 128/82 | HR 82 | Temp 97.9°F | Resp 18 | Ht 63.0 in | Wt 174.0 lb

## 2017-03-15 DIAGNOSIS — G8929 Other chronic pain: Secondary | ICD-10-CM

## 2017-03-15 DIAGNOSIS — Z0001 Encounter for general adult medical examination with abnormal findings: Secondary | ICD-10-CM

## 2017-03-15 DIAGNOSIS — Z0000 Encounter for general adult medical examination without abnormal findings: Secondary | ICD-10-CM

## 2017-03-15 DIAGNOSIS — M67972 Unspecified disorder of synovium and tendon, left ankle and foot: Secondary | ICD-10-CM

## 2017-03-15 DIAGNOSIS — E890 Postprocedural hypothyroidism: Secondary | ICD-10-CM

## 2017-03-15 DIAGNOSIS — Z124 Encounter for screening for malignant neoplasm of cervix: Secondary | ICD-10-CM | POA: Diagnosis not present

## 2017-03-15 DIAGNOSIS — E559 Vitamin D deficiency, unspecified: Secondary | ICD-10-CM

## 2017-03-15 DIAGNOSIS — R51 Headache: Secondary | ICD-10-CM

## 2017-03-15 DIAGNOSIS — R519 Headache, unspecified: Secondary | ICD-10-CM

## 2017-03-15 MED ORDER — NAPROXEN 500 MG PO TABS: 500.0000 mg | ORAL_TABLET | Freq: Two times a day (BID) | ORAL | 0 refills | Status: DC

## 2017-03-15 NOTE — Patient Instructions (Addendum)
The CDC recommends two doses of Shingrix (the shingles vaccine) separated by 2 to 6 months for adults age 54 years and older. I recommend checking with your insurance plan regarding coverage for this vaccine.   Please call the [facility] Achilles Tendinitis Achilles tendinitis is inflammation of the tough, cord-like band that attaches the lower leg muscles to the heel bone (Achilles tendon). This is usually caused by overusing the tendon and the ankle joint. Achilles tendinitis usually gets better over time with treatment and caring for yourself at home. It can take weeks or months to heal completely. What are the causes? This condition may be caused by: A sudden increase in exercise or activity, such as running. Doing the same exercises or activities (such as jumping) over and over. Not warming up calf muscles before exercising. Exercising in shoes that are worn out or not made for exercise. Having arthritis or a bone growth (spur) on the back of the heel bone. This can rub against the tendon and hurt it. Age-related wear and tear. Tendons become less flexible with age and more likely to be injured.  What are the signs or symptoms? Common symptoms of this condition include: Pain in the Achilles tendon or in the back of the leg, just above the heel. The pain usually gets worse with exercise. Stiffness or soreness in the back of the leg, especially in the morning. Swelling of the skin over the Achilles tendon. Thickening of the tendon. Bone spurs at the bottom of the Achilles tendon, near the heel. Trouble standing on tiptoe.  How is this diagnosed? This condition is diagnosed based on your symptoms and a physical exam. You may have tests, including: X-rays. MRI.  How is this treated? The goal of treatment is to relieve symptoms and help your injury heal. Treatment may include: Decreasing or stopping activities that caused the tendinitis. This may mean switching to low-impact  exercises like biking or swimming. Icing the injured area. Doing physical therapy, including strengthening and stretching exercises. NSAIDs to help relieve pain and swelling. Using supportive shoes, wraps, heel lifts, or a walking boot (air cast). Surgery. This may be done if your symptoms do not improve after 6 months. Using high-energy shock wave impulses to stimulate the healing process (extracorporeal shock wave therapy). This is rare. Injection of medicines to help relieve inflammation (corticosteroids). This is rare.  Follow these instructions at home: If you have an air cast: Wear the cast as told by your health care provider. Remove it only as told by your health care provider. Loosen the cast if your toes tingle, become numb, or turn cold and blue. Activity Gradually return to your normal activities once your health care provider approves. Do not do activities that cause pain. Consider doing low-impact exercises, like cycling or swimming. If you have an air cast, ask your health care provider when it is safe for you to drive. If physical therapy was prescribed, do exercises as told by your health care provider or physical therapist. Managing pain, stiffness, and swelling Raise (elevate) your foot above the level of your heart while you are sitting or lying down. Move your toes often to avoid stiffness and to lessen swelling. If directed, put ice on the injured area: Put ice in a plastic bag. Place a towel between your skin and the bag. Leave the ice on for 20 minutes, 2-3 times a day General instructions If directed, wrap your foot with an elastic bandage or other wrap. This can  help keep your tendon from moving too much while it heals. Your health care provider will show you how to wrap your foot correctly. Wear supportive shoes or heel lifts only as told by your health care provider. Take over-the-counter and prescription medicines only as told by your health care  provider. Keep all follow-up visits as told by your health care provider. This is important. Contact a health care provider if: You have symptoms that gets worse. You have pain that does not get better with medicine. You develop new, unexplained symptoms. You develop warmth and swelling in your foot. You have a fever. Get help right away if: You have a sudden popping sound or sensation in your Achilles tendon followed by severe pain. You cannot move your toes or foot. You cannot put any weight on your foot. Summary Achilles tendinitis is inflammation of the tough, cord-like band that attaches the lower leg muscles to the heel bone (Achilles tendon). This condition is usually caused by overusing the tendon and the ankle joint. It can also be caused by arthritis or normal aging. The most common symptoms of this condition include pain, swelling, or stiffness in the Achilles tendon or in the back of the leg. This condition is usually treated with rest, NSAIDs, and physical therapy. This information is not intended to replace advice given to you by your health care provider. Make sure you discuss any questions you have with your health care provider. Document Released: 12/14/2004 Document Revised: 01/24/2016 Document Reviewed: 01/24/2016 Elsevier Interactive Patient Education  2017 [place] [phone]) to schedule a routine screening mammogram.

## 2017-03-19 LAB — PAP IG AND HPV HIGH-RISK: HPV DNA HIGH RISK: NOT DETECTED

## 2017-03-21 ENCOUNTER — Other Ambulatory Visit: Payer: Self-pay | Admitting: Family Medicine

## 2017-03-21 DIAGNOSIS — Z1231 Encounter for screening mammogram for malignant neoplasm of breast: Secondary | ICD-10-CM

## 2017-03-22 LAB — LIPID PANEL
Chol/HDL Ratio: 3.9 ratio (ref 0.0–4.4)
Cholesterol, Total: 207 mg/dL — ABNORMAL HIGH (ref 100–199)
HDL: 53 mg/dL (ref >39)
LDL Calculated: 134 mg/dL — ABNORMAL HIGH (ref 0–99)
Triglycerides: 100 mg/dL (ref 0–149)
VLDL CHOLESTEROL CAL: 20 mg/dL (ref 5–40)

## 2017-03-22 LAB — COMPREHENSIVE METABOLIC PANEL
ALBUMIN: 4.4 g/dL (ref 3.5–5.5)
ALT: 16 IU/L (ref 0–32)
AST: 15 IU/L (ref 0–40)
Albumin/Globulin Ratio: 1.8 (ref 1.2–2.2)
Alkaline Phosphatase: 103 IU/L (ref 39–117)
BUN / CREAT RATIO: 9 (ref 9–23)
BUN: 7 mg/dL (ref 6–24)
Bilirubin Total: 0.4 mg/dL (ref 0.0–1.2)
CALCIUM: 9 mg/dL (ref 8.7–10.2)
CO2: 22 mmol/L (ref 20–29)
CREATININE: 0.8 mg/dL (ref 0.57–1.00)
Chloride: 104 mmol/L (ref 96–106)
GFR calc Af Amer: 97 mL/min/{1.73_m2} (ref >59)
GFR calc non Af Amer: 84 mL/min/{1.73_m2} (ref >59)
GLUCOSE: 111 mg/dL — AB (ref 65–99)
Globulin, Total: 2.4 g/dL (ref 1.5–4.5)
Potassium: 4 mmol/L (ref 3.5–5.2)
Sodium: 142 mmol/L (ref 134–144)
Total Protein: 6.8 g/dL (ref 6.0–8.5)

## 2017-03-22 LAB — VITAMIN D 25 HYDROXY (VIT D DEFICIENCY, FRACTURES): VIT D 25 HYDROXY: 16.3 ng/mL — AB (ref 30.0–100.0)

## 2017-03-22 LAB — TSH: TSH: 0.973 u[IU]/mL (ref 0.450–4.500)

## 2017-03-28 ENCOUNTER — Other Ambulatory Visit: Payer: Self-pay | Admitting: Family Medicine

## 2017-03-28 DIAGNOSIS — M67972 Unspecified disorder of synovium and tendon, left ankle and foot: Secondary | ICD-10-CM

## 2017-03-28 MED ORDER — NAPROXEN 500 MG PO TABS: 500.0000 mg | ORAL_TABLET | Freq: Two times a day (BID) | ORAL | 3 refills | Status: DC

## 2017-03-28 NOTE — Telephone Encounter (Signed)
[facility] for a 90-days supply for the following medication. Thanks CC ° °naproxen (NAPROSYN) 500 MG tablet  ° °

## 2017-04-06 ENCOUNTER — Other Ambulatory Visit: Payer: Self-pay | Admitting: Family Medicine

## 2017-04-06 NOTE — Telephone Encounter (Signed)
Pharmacy requesting refills. Thanks!  

## 2017-04-17 ENCOUNTER — Ambulatory Visit
Admission: RE | Admit: 2017-04-17 | Discharge: 2017-04-17 | Disposition: A | Discharge status: Home / Self Care | Payer: BC Managed Care – PPO | Source: Ambulatory Visit | Attending: Family Medicine | Admitting: Family Medicine

## 2017-04-17 DIAGNOSIS — Z1231 Encounter for screening mammogram for malignant neoplasm of breast: Secondary | ICD-10-CM | POA: Diagnosis not present

## 2017-04-19 ENCOUNTER — Other Ambulatory Visit: Payer: Self-pay | Admitting: Family Medicine

## 2017-04-19 DIAGNOSIS — R928 Other abnormal and inconclusive findings on diagnostic imaging of breast: Secondary | ICD-10-CM

## 2017-04-19 DIAGNOSIS — N6320 Unspecified lump in the left breast, unspecified quadrant: Secondary | ICD-10-CM

## 2017-04-30 ENCOUNTER — Ambulatory Visit
Admission: RE | Admit: 2017-04-30 | Discharge: 2017-04-30 | Disposition: A | Discharge status: Home / Self Care | Payer: BC Managed Care – PPO | Source: Ambulatory Visit | Attending: Family Medicine | Admitting: Family Medicine

## 2017-04-30 DIAGNOSIS — N6002 Solitary cyst of left breast: Secondary | ICD-10-CM | POA: Diagnosis not present

## 2017-04-30 DIAGNOSIS — N6320 Unspecified lump in the left breast, unspecified quadrant: Secondary | ICD-10-CM

## 2017-04-30 DIAGNOSIS — R928 Other abnormal and inconclusive findings on diagnostic imaging of breast: Secondary | ICD-10-CM

## 2017-05-18 DIAGNOSIS — K5792 Diverticulitis of intestine, part unspecified, without perforation or abscess without bleeding: Secondary | ICD-10-CM | POA: Insufficient documentation

## 2017-06-25 ENCOUNTER — Telehealth: Payer: Self-pay | Admitting: Family Medicine

## 2017-06-25 NOTE — Telephone Encounter (Signed)
ROI to [place] for 2014, 2015, 2016, 2017

## 2017-09-03 ENCOUNTER — Telehealth: Payer: Self-pay

## 2017-09-03 NOTE — Telephone Encounter (Signed)
Pt has a flight scheduled for next week and would like scopolamine patches sent to [facility] to prevent nausea.   Contact number:  [phone]  Thanks,   -Mickel Baas

## 2017-09-03 NOTE — Telephone Encounter (Signed)
The patches have long onset and duration of action and are usually only prescribed when you need something to last 3-4 days... Like a cruise. If she just needs something for a flight I would suggest meclizine 25 tablets

## 2017-09-04 MED ORDER — SCOPOLAMINE 1 MG/3DAYS TD PT72: 1.0000 | MEDICATED_PATCH | TRANSDERMAL | 0 refills | Status: AC | PRN

## 2017-09-04 NOTE — Telephone Encounter (Signed)
Patient was advised. Patient is still requesting scopolamine patches. Patient states she did well on patches that Dr. Caryn Section prescribed last June 2018. Please advise?

## 2017-11-14 ENCOUNTER — Ambulatory Visit (INDEPENDENT_AMBULATORY_CARE_PROVIDER_SITE_OTHER): Payer: BC Managed Care – PPO | Admitting: Family Medicine

## 2017-11-14 ENCOUNTER — Encounter: Payer: Self-pay | Admitting: Family Medicine

## 2017-11-14 VITALS — BP 134/83 | HR 85 | Temp 98.5°F | Resp 16 | Wt 175.0 lb

## 2017-11-14 DIAGNOSIS — S8001XA Contusion of right knee, initial encounter: Secondary | ICD-10-CM

## 2017-11-14 DIAGNOSIS — S80211A Abrasion, right knee, initial encounter: Secondary | ICD-10-CM | POA: Diagnosis not present

## 2017-11-14 DIAGNOSIS — S9002XA Contusion of left ankle, initial encounter: Secondary | ICD-10-CM

## 2017-11-14 DIAGNOSIS — L03115 Cellulitis of right lower limb: Secondary | ICD-10-CM | POA: Diagnosis not present

## 2017-11-14 MED ORDER — SILVER SULFADIAZINE 1 % EX CREA: 1.0000 "application " | TOPICAL_CREAM | Freq: Every day | CUTANEOUS | 0 refills | Status: DC

## 2017-11-14 MED ORDER — CLINDAMYCIN HCL 300 MG PO CAPS: 300.0000 mg | ORAL_CAPSULE | Freq: Three times a day (TID) | ORAL | 0 refills | Status: AC

## 2017-11-14 NOTE — Progress Notes (Signed)
Patient: Lisa Simmons Female    DOB: Aug 26, 1960   57 y.o.   MRN: [id number] Visit Date: 11/14/2017  Today's Provider: Lelon Huh, MD   Chief Complaint  Patient presents with  . Wound Check  . Foot Injury   Subjective:    HPI Wound check: Patient comes in today reporting that 5 days ago she fell while stepping off her golf cart. She states her left ankle gave out and she fell onto her right knee. Since then she has had pain and bruising of the left foot along with pain redness, swelling and drainage from the right knee. She has been applying Neosporin to the wound and  keeping it covered with gauze.  Her left foot and ankle are still swollen, but she reports they have improved significantly over the last 2 days and she is able to bear weight and ambulate.   However her right knee had been improving And was feeling much better until the last 2 days, when it has become significantly more swollen and painful, and is very painful to walk on. She has noticed more oozing from abrasion on knee the last two days. Has not had any fevers or chills.     Allergies  Allergen Reactions  . Avelox  [Moxifloxacin Hcl In Nacl]     Other reaction(s): Rapid pulse  . Iodine     Other reaction(s): Hives  . Penicillin G Rash     Current Outpatient Medications:  .  levothyroxine (SYNTHROID, LEVOTHROID) 100 MCG tablet, Take 1 tablet by mouth daily. Reported on 03/16/2015, Disp: , Rfl:  .  omeprazole (PRILOSEC) 40 MG capsule, Take 1 capsule (40 mg total) by mouth daily., Disp: 90 capsule, Rfl: 3 .  sertraline (ZOLOFT) 50 MG tablet, TAKE 1 TABLET (50 MG TOTAL) BY MOUTH DAILY., Disp: 90 tablet, Rfl: 3 .  ALPRAZolam (XANAX) 0.25 MG tablet, TAKE 1 TABLET BY MOUTH AT BEDTIME AS NEEDED (Patient not taking: Reported on 11/14/2017), Disp: 30 tablet, Rfl: 4 .  naproxen (NAPROSYN) 500 MG tablet, Take 1 tablet (500 mg total) by mouth 2 (two) times daily with a meal. (Patient not taking: Reported on  11/14/2017), Disp: 90 tablet, Rfl: 3  Review of Systems  Constitutional: Negative for appetite change, chills, fatigue and fever.  Respiratory: Negative for chest tightness and shortness of breath.   Cardiovascular: Negative for chest pain and palpitations.  Gastrointestinal: Negative for abdominal pain, nausea and vomiting.  Musculoskeletal: Positive for arthralgias (left ankle and right knee) and joint swelling.  Skin: Positive for wound.  Neurological: Negative for dizziness and weakness.  Hematological: Bruises/bleeds easily.    Social History   Tobacco Use  . Smoking status: Former Smoker    Packs/day: 1.00    Years: 7.00    Pack years: 7.00    Types: Cigarettes    Last attempt to quit: 03/20/1986    Years since quitting: 31.6  . Smokeless tobacco: Never Used  Substance Use Topics  . Alcohol use: Yes    Alcohol/week: 0.0 standard drinks    Comment: 1-2 drinks per year   Objective:   BP 134/83 (BP Location: Left Arm, Patient Position: Sitting, Cuff Size: Large)   Pulse 85   Temp 98.5 F (36.9 C) (Oral)   Resp 16   Wt 175 lb (79.4 kg)   LMP 03/16/2015   SpO2 98% Comment: room air  BMI 31.00 kg/m  Vitals:   11/14/17 1631  BP: 134/83  Pulse:  85  Resp: 16  Temp: 98.5 F (36.9 C)  TempSrc: Oral  SpO2: 98%  Weight: 175 lb (79.4 kg)     Physical Exam   General Appearance:    Alert, cooperative, no distress  Eyes:    PERRL, conjunctiva/corneas clear, EOM's intact       Lungs:     Clear to auscultation bilaterally, respirations unlabored  Heart:    Regular rate and rhythm  Ext:   3 Cm dm full thickness abrasion right lower leg over tibial tuberosity with small amount of purulent discharge, with small non draining abrasion about 1 cm in dm superior to larger abrasion. Moderate swelling, slight erythema but joint not hot to touch.           Assessment & Plan:     1. Abrasion of right knee, initial encounter Some drainage noted.  - silver sulfADIAZINE  (SILVADENE) 1 % cream; Apply 1 application topically daily.  Dispense: 25 g; Refill: 0 - Aerobic culture  2. Contusion of left ankle, initial encounter Healing well. Continue wearing ankle wrap and expect another 2-3 weeks to heal.   3. Contusion of right knee, initial encounter   4. Cellulitis of right lower extremity  - clindamycin (CLEOCIN) 300 MG capsule; Take 1 capsule (300 mg total) by mouth 3 (three) times daily for 7 days.  Dispense: 21 capsule; Refill: 0       Lelon Huh, MD  [facility]

## 2017-11-16 LAB — AEROBIC CULTURE

## 2017-12-03 ENCOUNTER — Other Ambulatory Visit: Payer: Self-pay | Admitting: Family Medicine

## 2017-12-20 ENCOUNTER — Encounter: Payer: Self-pay | Admitting: Family Medicine

## 2017-12-20 ENCOUNTER — Ambulatory Visit: Payer: BC Managed Care – PPO | Admitting: Family Medicine

## 2017-12-20 VITALS — BP 122/84 | HR 84 | Temp 98.4°F | Resp 16 | Wt 171.6 lb

## 2017-12-20 DIAGNOSIS — Z8719 Personal history of other diseases of the digestive system: Secondary | ICD-10-CM | POA: Diagnosis not present

## 2017-12-20 DIAGNOSIS — R300 Dysuria: Secondary | ICD-10-CM | POA: Diagnosis not present

## 2017-12-20 DIAGNOSIS — R1030 Lower abdominal pain, unspecified: Secondary | ICD-10-CM

## 2017-12-20 LAB — POCT URINALYSIS DIPSTICK
GLUCOSE UA: NEGATIVE
KETONES UA: NEGATIVE
Nitrite, UA: POSITIVE
Protein, UA: POSITIVE — AB
Spec Grav, UA: 1.015 (ref 1.010–1.025)
UROBILINOGEN UA: 2 U/dL — AB
pH, UA: 7 (ref 5.0–8.0)

## 2017-12-20 MED ORDER — CIPROFLOXACIN HCL 500 MG PO TABS: 500.0000 mg | ORAL_TABLET | Freq: Two times a day (BID) | ORAL | 0 refills | Status: DC

## 2017-12-20 MED ORDER — METRONIDAZOLE 500 MG PO TABS: 500.0000 mg | ORAL_TABLET | Freq: Three times a day (TID) | ORAL | 0 refills | Status: DC

## 2017-12-20 NOTE — Patient Instructions (Signed)
We will call you with the urine culture report. Put your bowel to rest with soup and clear liquids. If fever returns or pain not improving report to the ER.

## 2017-12-20 NOTE — Progress Notes (Signed)
  Subjective:     Patient ID: Lisa Simmons, female   DOB: Jan 22, 1961, 57 y.o.   MRN: [id number] Chief Complaint  Patient presents with  . Urinary Tract Infection    Patient here today for possible urinary tract infection at occurred on Sunday, 12/16/17. Patient states she is having the following symptoms that worsen on Tuesday, 12/18/17 burning, fever ( highest 101.), left lower abdominal pain, and sleeping a lot. Patient states she has been taking over the counter AZO 1 tablet daily and symptoms has not cleared.   HPI States she had a formed bowel movement 9/28 then 9/30 had "the worse IBS episode I have ever had" with loose stools. Has had CT proven diverticulitis one year ago.  Review of Systems  Gastrointestinal: Positive for nausea (mild). Negative for vomiting.       Objective:   Physical Exam  Constitutional: She appears well-developed and well-nourished. She appears distressed (mild to moderate pain ).  Abdominal: Bowel sounds are normal. There is tenderness (LLQ and RLQ). There is guarding (RLQ).       Assessment:    1. Dysuria - POCT urinalysis dipstick - Urine Culture  2. Lower abdominal pain: will cover for diverticulitis with metronidazole and Cipro.  3. History of diverticulitis     Plan:    Further f/u pending urine culture results. Discussed soups and clear liquids. Report to the ER if fever returns or abd. Pain not improving.

## 2017-12-24 ENCOUNTER — Telehealth: Payer: Self-pay

## 2017-12-24 LAB — URINE CULTURE

## 2017-12-24 NOTE — Telephone Encounter (Signed)
Patient was advised. Patient states she is having a pinching pain in lower abdominal area and burning during urination.  Patient want to know if she should continue the Cipro?

## 2017-12-24 NOTE — Telephone Encounter (Signed)
Continue both antibiotics and see Dr. Caryn Section in the next day or two. If you have developed a high fever report to the ER.

## 2017-12-24 NOTE — Telephone Encounter (Signed)
-----   Message from Carmon Ginsberg, [place] sent at 12/24/2017  9:39 AM EDT ----- No bladder infection. Continue antibiotics for diverticulitis.

## 2017-12-24 NOTE — Telephone Encounter (Signed)
Patient advised.KW 

## 2017-12-26 ENCOUNTER — Ambulatory Visit
Admission: RE | Admit: 2017-12-26 | Discharge: 2017-12-26 | Disposition: A | Discharge status: Home / Self Care | Payer: BC Managed Care – PPO | Source: Ambulatory Visit | Attending: Family Medicine | Admitting: Family Medicine

## 2017-12-26 ENCOUNTER — Ambulatory Visit: Payer: BC Managed Care – PPO | Admitting: Family Medicine

## 2017-12-26 ENCOUNTER — Encounter: Payer: Self-pay | Admitting: Family Medicine

## 2017-12-26 VITALS — BP 110/74 | HR 85 | Temp 98.5°F | Resp 16 | Ht 63.0 in | Wt 172.0 lb

## 2017-12-26 DIAGNOSIS — R109 Unspecified abdominal pain: Secondary | ICD-10-CM

## 2017-12-26 DIAGNOSIS — R1030 Lower abdominal pain, unspecified: Secondary | ICD-10-CM | POA: Diagnosis not present

## 2017-12-26 LAB — POCT URINALYSIS DIPSTICK
Bilirubin, UA: NEGATIVE
GLUCOSE UA: NEGATIVE
KETONES UA: NEGATIVE
Nitrite, UA: NEGATIVE
Protein, UA: NEGATIVE
RBC UA: NEGATIVE
SPEC GRAV UA: 1.01 (ref 1.010–1.025)
UROBILINOGEN UA: 0.2 U/dL
pH, UA: 6.5 (ref 5.0–8.0)

## 2017-12-26 NOTE — Patient Instructions (Signed)
   Start taking an OTC Probiotic such as Electronics engineer every day

## 2017-12-26 NOTE — Progress Notes (Signed)
Patient: Lisa Simmons Female    DOB: [birth year]   57 y.o.   MRN: [id number] Visit Date: 12/26/2017  Today's Provider: Lelon Huh, MD   Chief Complaint  Patient presents with  . Abdominal Pain   Subjective:    Abdominal Pain  The problem has been unchanged (Patient was seen by Carmon Ginsberg PA-C on 12/20/2017 for abdominal pain and dysuria. She was treated with Metronidazole and Cipro to cover Diverticulitis. Urine culture was negative and patient was advised to follow up for re-evaluation of abdominal pain). Associated symptoms include diarrhea (dark colored stools), a fever and nausea. Pertinent negatives include no dysuria or vomiting.   She had abdominal CT for similar sx 12/22/2016: diverticulosis. hepatic steatosis. few small subcentimeter hypodense lesions that could not be characterized. She was treated at that time with metronidazole and ciprofloxacin with good results. She states that since starting antibiotic after 12-20-16 o.v. That LLQ is not really painful anymore, but is still sore. However has since started having bloating and soreness across her upper abdomina and epigastrium as above. She states stool is darker than usual. She has several loose BM per day, but she reports history of IBS-D and this is not change in usual bowel pattern. She states she did have low grad temperature around 99 last night.     Allergies  Allergen Reactions  . Avelox  [Moxifloxacin Hcl In Nacl]     Other reaction(s): Rapid pulse  . Iodine     Other reaction(s): Hives  . Penicillin G Rash     Current Outpatient Medications:  .  ciprofloxacin (CIPRO) 500 MG tablet, Take 1 tablet (500 mg total) by mouth 2 (two) times daily., Disp: 14 tablet, Rfl: 0 .  levothyroxine (SYNTHROID, LEVOTHROID) 100 MCG tablet, Take 1 tablet by mouth daily. Reported on 03/16/2015, Disp: , Rfl:  .  metroNIDAZOLE (FLAGYL) 500 MG tablet, Take 1 tablet (500 mg total) by mouth 3 (three) times daily., Disp: 21  tablet, Rfl: 0 .  omeprazole (PRILOSEC) 40 MG capsule, Take 1 capsule (40 mg total) by mouth daily., Disp: 90 capsule, Rfl: 3 .  sertraline (ZOLOFT) 50 MG tablet, TAKE 1 TABLET BY MOUTH EVERY DAY, Disp: 90 tablet, Rfl: 3  Review of Systems  Constitutional: Positive for fever. Negative for appetite change, chills and fatigue.  Respiratory: Negative for chest tightness and shortness of breath.   Cardiovascular: Negative for chest pain and palpitations.  Gastrointestinal: Positive for abdominal distention, abdominal pain (lower left side), diarrhea (dark colored stools) and nausea. Negative for vomiting.  Genitourinary: Negative for dysuria.       Dark colored urine  Neurological: Negative for dizziness and weakness.   Past Surgical History:  Procedure Laterality Date  . CESAREAN SECTION  1989  . [place]   x2  . SUPRACERVICAL ABDOMINAL HYSTERECTOMY  2003   due to Endometriosis and severe Anemia, Laproscopic; still has ovaries and cervix  . THYROIDECTOMY  06/2012   Dr. Jerral Bonito; due to multinodular goiter  . TONSILLECTOMY  1970     Social History   Tobacco Use  . Smoking status: Former Smoker    Packs/day: 1.00    Years: 7.00    Pack years: 7.00    Types: Cigarettes    Last attempt to quit: 03/20/1986    Years since quitting: 31.7  . Smokeless tobacco: Never Used  Substance Use Topics  . Alcohol use: Yes    Alcohol/week: 0.0  standard drinks    Comment: 1-2 drinks per year   Objective:   BP 110/74 (BP Location: Left Arm, Patient Position: Sitting, Cuff Size: Large)   Pulse 85   Temp 98.5 F (36.9 C) (Oral)   Resp 16   Ht 5\' 3"  (1.6 m)   Wt 172 lb (78 kg)   LMP 03/16/2015   SpO2 97% Comment: room air  BMI 30.47 kg/m  Vitals:   12/26/17 1107  BP: 110/74  Pulse: 85  Resp: 16  Temp: 98.5 F (36.9 C)  TempSrc: Oral  SpO2: 97%  Weight: 172 lb (78 kg)  Height: 5\' 3"  (1.6 m)     Physical Exam   General Appearance:    Alert,  cooperative, no distress  Eyes:    PERRL, conjunctiva/corneas clear, EOM's intact       Lungs:     Clear to auscultation bilaterally, respirations unlabored  Heart:    Regular rate and rhythm  Abdomen:   bowel sounds present and normal in all 4 quadrants. Mild diffuse tenderness, no rebound, no guarding. Mildly distended throughout. No masses. No CVAT.     Results for orders placed or performed in visit on 12/26/17  POCT Urinalysis Dipstick  Result Value Ref Range   Color, UA dark yellow    Clarity, UA clear    Glucose, UA Negative Negative   Bilirubin, UA negative    Ketones, UA negative    Spec Grav, UA 1.010 1.010 - 1.025   Blood, UA negative    pH, UA 6.5 5.0 - 8.0   Protein, UA Negative Negative   Urobilinogen, UA 0.2 0.2 or 1.0 E.U./dL   Nitrite, UA negative    Leukocytes, UA Moderate (2+) (A) Negative   Appearance     Odor         Assessment & Plan:     1. Lower abdominal pain Improved on ciprofloxacin and metronidazole which she is to continue.  - POCT Urinalysis Dipstick  2. Abdominal pain, unspecified abdominal location No having some distension and gas, which may be related to antibiotic. May also be IBS flare.  Is to start OTC pro-biotic while awaiting test results.  - DG Abd 2 Views; Future - CBC - Comprehensive metabolic panel - H. pylori breath test - Amylase       Lelon Huh, MD  [facility]

## 2017-12-27 LAB — COMPREHENSIVE METABOLIC PANEL
A/G RATIO: 1.9 (ref 1.2–2.2)
ALT: 27 IU/L (ref 0–32)
AST: 31 IU/L (ref 0–40)
Albumin: 4.3 g/dL (ref 3.5–5.5)
Alkaline Phosphatase: 77 IU/L (ref 39–117)
BUN/Creatinine Ratio: 7 — ABNORMAL LOW (ref 9–23)
BUN: 5 mg/dL — ABNORMAL LOW (ref 6–24)
Bilirubin Total: 0.4 mg/dL (ref 0.0–1.2)
CALCIUM: 9.6 mg/dL (ref 8.7–10.2)
CO2: 22 mmol/L (ref 20–29)
CREATININE: 0.69 mg/dL (ref 0.57–1.00)
Chloride: 102 mmol/L (ref 96–106)
GFR, EST AFRICAN AMERICAN: 113 mL/min/{1.73_m2} (ref >59)
GFR, EST NON AFRICAN AMERICAN: 98 mL/min/{1.73_m2} (ref >59)
GLOBULIN, TOTAL: 2.3 g/dL (ref 1.5–4.5)
Glucose: 86 mg/dL (ref 65–99)
POTASSIUM: 3.8 mmol/L (ref 3.5–5.2)
Sodium: 145 mmol/L — ABNORMAL HIGH (ref 134–144)
TOTAL PROTEIN: 6.6 g/dL (ref 6.0–8.5)

## 2017-12-27 LAB — CBC
HEMATOCRIT: 36.3 % (ref 34.0–46.6)
Hemoglobin: 12.3 g/dL (ref 11.1–15.9)
MCH: 31.2 pg (ref 26.6–33.0)
MCHC: 33.9 g/dL (ref 31.5–35.7)
MCV: 92 fL (ref 79–97)
PLATELETS: 273 10*3/uL (ref 150–450)
RBC: 3.94 x10E6/uL (ref 3.77–5.28)
RDW: 12.8 % (ref 12.3–15.4)
WBC: 9.8 10*3/uL (ref 3.4–10.8)

## 2017-12-27 LAB — AMYLASE: AMYLASE: 23 U/L — AB (ref 31–124)

## 2017-12-27 LAB — H. PYLORI BREATH TEST: H pylori Breath Test: NEGATIVE

## 2017-12-28 ENCOUNTER — Telehealth: Payer: Self-pay | Admitting: Family Medicine

## 2017-12-28 ENCOUNTER — Telehealth: Payer: Self-pay

## 2017-12-28 MED ORDER — FLUCONAZOLE 150 MG PO TABS: 150.0000 mg | ORAL_TABLET | Freq: Once | ORAL | 3 refills | Status: AC

## 2017-12-28 NOTE — Telephone Encounter (Signed)
°  Diflucan - pt wanting to know if this can be called in for her since she has been on antibiotics prescribed by Dr. Caryn Section.   She has a vaginal yeast infection and on her tongue.  If it can be call into: [id number] - [place], [place] - [phone] (Phone) [phone] (Fax)   Please advise.  Thanks, [employer]

## 2017-12-28 NOTE — Telephone Encounter (Signed)
LMTCB 12/28/2017  Thanks,   -Mickel Baas

## 2017-12-28 NOTE — Telephone Encounter (Signed)
Pt advised.   Thanks,   -Kambree Krauss  

## 2017-12-28 NOTE — Telephone Encounter (Signed)
-----   Message from Birdie Sons, MD sent at 12/28/2017 10:32 AM EDT ----- H. Pylori breath test is negative. She has probably had an IBS flare. Continue pro-biotics.should improve over the weekend. Let me know if not.

## 2018-01-23 ENCOUNTER — Other Ambulatory Visit (HOSPITAL_COMMUNITY)
Admission: RE | Admit: 2018-01-23 | Discharge: 2018-01-23 | Disposition: A | Discharge status: Home / Self Care | Payer: BC Managed Care – PPO | Source: Ambulatory Visit | Attending: Physician Assistant | Admitting: Physician Assistant

## 2018-01-23 ENCOUNTER — Encounter: Payer: Self-pay | Admitting: Physician Assistant

## 2018-01-23 ENCOUNTER — Ambulatory Visit: Payer: BC Managed Care – PPO | Admitting: Physician Assistant

## 2018-01-23 VITALS — BP 130/90 | HR 78 | Temp 98.3°F | Resp 16 | Wt 176.0 lb

## 2018-01-23 DIAGNOSIS — N898 Other specified noninflammatory disorders of vagina: Secondary | ICD-10-CM | POA: Insufficient documentation

## 2018-01-23 MED ORDER — TERCONAZOLE 0.4 % VA CREA: TOPICAL_CREAM | VAGINAL | 0 refills | Status: DC

## 2018-01-23 NOTE — Patient Instructions (Signed)

## 2018-01-23 NOTE — Progress Notes (Signed)
Patient: Lisa Simmons Female    DOB: 1961/01/30   57 y.o.   MRN: [id number] Visit Date: 01/23/2018  Today's Provider: Trinna Post, PA-C   Chief Complaint  Patient presents with  . Rash   Subjective:    HPI Patient here today C/O blisters around vaginal area with itching. Patient reports she has a white coating around her vaginal area. She has been treated with diflucan previously but the symptoms have persisted. She denies fevers, chills, pelvic pain, new sexual partners.      Allergies  Allergen Reactions  . Avelox  [Moxifloxacin Hcl In Nacl]     Other reaction(s): Rapid pulse  . Iodine     Other reaction(s): Hives  . Penicillin G Rash     Current Outpatient Medications:  .  levothyroxine (SYNTHROID, LEVOTHROID) 100 MCG tablet, Take 1 tablet by mouth daily. Reported on 03/16/2015, Disp: , Rfl:  .  omeprazole (PRILOSEC) 40 MG capsule, Take 1 capsule (40 mg total) by mouth daily., Disp: 90 capsule, Rfl: 3 .  sertraline (ZOLOFT) 50 MG tablet, TAKE 1 TABLET BY MOUTH EVERY DAY, Disp: 90 tablet, Rfl: 3  Review of Systems  Constitutional: Negative.   Genitourinary: Negative.   Skin: Positive for color change and rash.    Social History   Tobacco Use  . Smoking status: Former Smoker    Packs/day: 1.00    Years: 7.00    Pack years: 7.00    Types: Cigarettes    Last attempt to quit: 03/20/1986    Years since quitting: 31.8  . Smokeless tobacco: Never Used  Substance Use Topics  . Alcohol use: Yes    Alcohol/week: 0.0 standard drinks    Comment: 1-2 drinks per year   Objective:   BP 130/90 (BP Location: Left Arm, Patient Position: Sitting, Cuff Size: Large)   Pulse 78   Temp 98.3 F (36.8 C) (Oral)   Resp 16   Wt 176 lb (79.8 kg)   LMP 03/16/2015   SpO2 97%   BMI 31.18 kg/m  Vitals:   01/23/18 1616  BP: 130/90  Pulse: 78  Resp: 16  Temp: 98.3 F (36.8 C)  TempSrc: Oral  SpO2: 97%  Weight: 176 lb (79.8 kg)     Physical Exam    Constitutional: She is oriented to person, place, and time. She appears well-developed and well-nourished.  Cardiovascular: Normal rate.  Pulmonary/Chest: Effort normal.  Genitourinary: There is rash and lesion on the right labia. There is rash and lesion on the left labia.  Genitourinary Comments: There is significant erythema of the labia majora bilaterally with some desquamation. There is also some thick white coating overlying the clitoral hood.   Neurological: She is alert and oriented to person, place, and time.  Skin: Skin is warm and dry.  Psychiatric: She has a normal mood and affect. Her behavior is normal.        Assessment & Plan:     1. Vaginal discharge  Swabs taken, apply terconazole cream externally nightly. Will call back with results.   - terconazole (TERAZOL 7) 0.4 % vaginal cream; Apply to vulva once nightly.  Dispense: 45 g; Refill: 0 - Cervicovaginal ancillary only  2. Vaginal itching  - terconazole (TERAZOL 7) 0.4 % vaginal cream; Apply to vulva once nightly.  Dispense: 45 g; Refill: 0 - Cervicovaginal ancillary only  3. Vaginal lesion  - Herpes simplex virus culture  Return if symptoms worsen or fail to  improve.  The entirety of the information documented in the History of Present Illness, Review of Systems and Physical Exam were personally obtained by me. Portions of this information were initially documented by Lynford Humphrey, CMA and reviewed by me for thoroughness and accuracy.        Trinna Post, PA-C  [facility]

## 2018-01-25 LAB — CERVICOVAGINAL ANCILLARY ONLY
Bacterial vaginitis: NEGATIVE
Candida vaginitis: POSITIVE — AB
Chlamydia: NEGATIVE
Neisseria Gonorrhea: NEGATIVE
Trichomonas: NEGATIVE

## 2018-01-26 LAB — HERPES SIMPLEX VIRUS CULTURE

## 2018-02-11 ENCOUNTER — Other Ambulatory Visit: Payer: Self-pay | Admitting: Family Medicine

## 2018-02-11 DIAGNOSIS — Z1231 Encounter for screening mammogram for malignant neoplasm of breast: Secondary | ICD-10-CM

## 2018-03-05 ENCOUNTER — Ambulatory Visit
Admission: RE | Admit: 2018-03-05 | Discharge: 2018-03-05 | Disposition: A | Discharge status: Home / Self Care | Payer: BC Managed Care – PPO | Source: Ambulatory Visit | Attending: Family Medicine | Admitting: Family Medicine

## 2018-03-05 DIAGNOSIS — Z1231 Encounter for screening mammogram for malignant neoplasm of breast: Secondary | ICD-10-CM

## 2018-03-06 ENCOUNTER — Encounter: Payer: Self-pay | Admitting: Family Medicine

## 2018-03-06 ENCOUNTER — Ambulatory Visit (INDEPENDENT_AMBULATORY_CARE_PROVIDER_SITE_OTHER): Payer: BC Managed Care – PPO | Admitting: Family Medicine

## 2018-03-06 VITALS — BP 142/100 | HR 69 | Temp 97.6°F | Resp 16 | Ht 63.0 in | Wt 173.0 lb

## 2018-03-06 DIAGNOSIS — E559 Vitamin D deficiency, unspecified: Secondary | ICD-10-CM

## 2018-03-06 DIAGNOSIS — Z6830 Body mass index (BMI) 30.0-30.9, adult: Secondary | ICD-10-CM

## 2018-03-06 DIAGNOSIS — Z0000 Encounter for general adult medical examination without abnormal findings: Secondary | ICD-10-CM

## 2018-03-06 DIAGNOSIS — R030 Elevated blood-pressure reading, without diagnosis of hypertension: Secondary | ICD-10-CM

## 2018-03-06 DIAGNOSIS — E890 Postprocedural hypothyroidism: Secondary | ICD-10-CM

## 2018-03-06 DIAGNOSIS — N951 Menopausal and female climacteric states: Secondary | ICD-10-CM

## 2018-03-06 DIAGNOSIS — G4700 Insomnia, unspecified: Secondary | ICD-10-CM

## 2018-03-06 MED ORDER — ALPRAZOLAM 0.25 MG PO TABS: 0.2500 mg | ORAL_TABLET | Freq: Every evening | ORAL | 1 refills | Status: DC | PRN

## 2018-03-06 NOTE — Patient Instructions (Signed)
. Please contact your eyecare professional to schedule a routine eye exam   Check your blood pressure at home or at work once a week. Let me know if it it stays over 140 consistently.   . It is recommended to engage in 150 minutes of moderate exercise every week.    You should receive a Cologuard kit for colon cancer screening before the end of January. Let me know if not   The CDC recommends two doses of Shingrix (the shingles vaccine) separated by 2 to 6 months for adults age 55 years and older. I recommend checking with your insurance plan regarding coverage for this vaccine.      DASH Eating Plan DASH stands for "Dietary Approaches to Stop Hypertension." The DASH eating plan is a healthy eating plan that has been shown to reduce high blood pressure (hypertension). It may also reduce your risk for type 2 diabetes, heart disease, and stroke. The DASH eating plan may also help with weight loss. What are tips for following this plan?  General guidelines  Avoid eating more than 2,300 mg (milligrams) of salt (sodium) a day. If you have hypertension, you may need to reduce your sodium intake to 1,500 mg a day.  Limit alcohol intake to no more than 1 drink a day for nonpregnant women and 2 drinks a day for men. One drink equals 12 oz of beer, 5 oz of wine, or 1 oz of hard liquor.  Work with your health care provider to maintain a healthy body weight or to lose weight. Ask what an ideal weight is for you.  Get at least 30 minutes of exercise that causes your heart to beat faster (aerobic exercise) most days of the week. Activities may include walking, swimming, or biking.  Work with your health care provider or diet and nutrition specialist (dietitian) to adjust your eating plan to your individual calorie needs. Reading food labels   Check food labels for the amount of sodium per serving. Choose foods with less than 5 percent of the Daily Value of sodium. Generally, foods with less than  300 mg of sodium per serving fit into this eating plan.  To find whole grains, look for the word "whole" as the first word in the ingredient list. Shopping  Buy products labeled as "low-sodium" or "no salt added."  Buy fresh foods. Avoid canned foods and premade or frozen meals. Cooking  Avoid adding salt when cooking. Use salt-free seasonings or herbs instead of table salt or sea salt. Check with your health care provider or pharmacist before using salt substitutes.  Do not fry foods. Cook foods using healthy methods such as baking, boiling, grilling, and broiling instead.  Cook with heart-healthy oils, such as olive, canola, soybean, or sunflower oil. Meal planning  Eat a balanced diet that includes: ? 5 or more servings of fruits and vegetables each day. At each meal, try to fill half of your plate with fruits and vegetables. ? Up to 6-8 servings of whole grains each day. ? Less than 6 oz of lean meat, poultry, or fish each day. A 3-oz serving of meat is about the same size as a deck of cards. One egg equals 1 oz. ? 2 servings of low-fat dairy each day. ? A serving of nuts, seeds, or beans 5 times each week. ? Heart-healthy fats. Healthy fats called Omega-3 fatty acids are found in foods such as flaxseeds and coldwater fish, like sardines, salmon, and mackerel.  Limit how much  you eat of the following: ? Canned or prepackaged foods. ? Food that is high in trans fat, such as fried foods. ? Food that is high in saturated fat, such as fatty meat. ? Sweets, desserts, sugary drinks, and other foods with added sugar. ? Full-fat dairy products.  Do not salt foods before eating.  Try to eat at least 2 vegetarian meals each week.  Eat more home-cooked food and less restaurant, buffet, and fast food.  When eating at a restaurant, ask that your food be prepared with less salt or no salt, if possible. What foods are recommended? The items listed may not be a complete list. Talk with  your dietitian about what dietary choices are best for you. Grains Whole-grain or whole-wheat bread. Whole-grain or whole-wheat pasta. Brown rice. [place] Morrow. Bulgur. Whole-grain and low-sodium cereals. Pita bread. Low-fat, low-sodium crackers. Whole-wheat flour tortillas. Vegetables Fresh or frozen vegetables (raw, steamed, roasted, or grilled). Low-sodium or reduced-sodium tomato and vegetable juice. Low-sodium or reduced-sodium tomato sauce and tomato paste. Low-sodium or reduced-sodium canned vegetables. Fruits All fresh, dried, or frozen fruit. Canned fruit in natural juice (without added sugar). Meat and other protein foods Skinless chicken or [place]. Ground chicken or [place]. Pork with fat trimmed off. Fish and seafood. Egg whites. Dried beans, peas, or lentils. Unsalted nuts, nut butters, and seeds. Unsalted canned beans. Lean cuts of beef with fat trimmed off. Low-sodium, lean deli meat. Dairy Low-fat (1%) or fat-free (skim) milk. Fat-free, low-fat, or reduced-fat cheeses. Nonfat, low-sodium ricotta or cottage cheese. Low-fat or nonfat yogurt. Low-fat, low-sodium cheese. Fats and oils Soft margarine without trans fats. Vegetable oil. Low-fat, reduced-fat, or light mayonnaise and salad dressings (reduced-sodium). Canola, safflower, olive, soybean, and sunflower oils. Avocado. Seasoning and other foods Herbs. Spices. Seasoning mixes without salt. Unsalted popcorn and pretzels. Fat-free sweets. What foods are not recommended? The items listed may not be a complete list. Talk with your dietitian about what dietary choices are best for you. Grains Baked goods made with fat, such as croissants, muffins, or some breads. Dry pasta or rice meal packs. Vegetables Creamed or fried vegetables. Vegetables in a cheese sauce. Regular canned vegetables (not low-sodium or reduced-sodium). Regular canned tomato sauce and paste (not low-sodium or reduced-sodium). Regular tomato and vegetable juice  (not low-sodium or reduced-sodium). Angie Fava. Olives. Fruits Canned fruit in a light or heavy syrup. Fried fruit. Fruit in cream or butter sauce. Meat and other protein foods Fatty cuts of meat. Ribs. Fried meat. Berniece Salines. Sausage. Bologna and other processed lunch meats. Salami. Fatback. Hotdogs. Bratwurst. Salted nuts and seeds. Canned beans with added salt. Canned or smoked fish. Whole eggs or egg yolks. Chicken or [place] with skin. Dairy Whole or 2% milk, cream, and half-and-half. Whole or full-fat cream cheese. Whole-fat or sweetened yogurt. Full-fat cheese. Nondairy creamers. Whipped toppings. Processed cheese and cheese spreads. Fats and oils Butter. Stick margarine. Lard. Shortening. Ghee. Bacon fat. Tropical oils, such as coconut, palm kernel, or palm oil. Seasoning and other foods Salted popcorn and pretzels. Onion salt, garlic salt, seasoned salt, table salt, and sea salt. Worcestershire sauce. Tartar sauce. Barbecue sauce. Teriyaki sauce. Soy sauce, including reduced-sodium. Steak sauce. Canned and packaged gravies. Fish sauce. Oyster sauce. Cocktail sauce. Horseradish that you find on the shelf. Ketchup. Mustard. Meat flavorings and tenderizers. Bouillon cubes. Hot sauce and Tabasco sauce. Premade or packaged marinades. Premade or packaged taco seasonings. Relishes. Regular salad dressings. Where to find more information:  National Heart, Lung, and Blood Institute: https://wilson-eaton.com/  American Heart Association: www.heart.org Summary  The DASH eating plan is a healthy eating plan that has been shown to reduce high blood pressure (hypertension). It may also reduce your risk for type 2 diabetes, heart disease, and stroke.  With the DASH eating plan, you should limit salt (sodium) intake to 2,300 mg a day. If you have hypertension, you may need to reduce your sodium intake to 1,500 mg a day.  When on the DASH eating plan, aim to eat more fresh fruits and vegetables, whole grains, lean  proteins, low-fat dairy, and heart-healthy fats.  Work with your health care provider or diet and nutrition specialist (dietitian) to adjust your eating plan to your individual calorie needs. This information is not intended to replace advice given to you by your health care provider. Make sure you discuss any questions you have with your health care provider. Document Released: 02/23/2011 Document Revised: 02/28/2016 Document Reviewed: 02/28/2016 Elsevier Interactive Patient Education  2019 [employer].

## 2018-03-06 NOTE — Progress Notes (Signed)
Patient: Lisa Simmons, Female    DOB: [birth year], 57 y.o.   MRN: [id number] Visit Date: 03/06/2018  Today's Provider: Lelon Huh, MD   Chief Complaint  Patient presents with  . Annual Exam  . Hypothyroidism   Subjective:    Annual physical exam Lisa Simmons is a 57 y.o. female who presents today for health maintenance and complete physical. She feels fairly well. She reports no regular exercising. She reports she is sleeping fairly well.  Wt Readings from Last 3 Encounters:  03/06/18 173 lb (78.5 kg)  01/23/18 176 lb (79.8 kg)  12/26/17 172 lb (78 kg)   BP Readings from Last 3 Encounters:  03/06/18 (!) 142/100  01/23/18 130/90  12/26/17 110/74    ----------------------------------------------------------------- Hypothyroidism: Patient was last seen for this problem 1 year ago and no changes were made. Patient reports good compliance with treatment, and good tolerance.  Lab Results  Component Value Date   TSH 0.973 03/21/2017    Vitamin D Deficiency: Patient was last seen for this problem 1 year ago. Changes during that visit includes starting Vitamin D 3 2,000 units daily. Today patient reports that she did take vitamin d with no adverse effect, but stopped when prescription ran out and has not been taking Vitamin D supplements.  Lab Results  Component Value Date   VD25OH 16.3 (L) 03/21/2017     She also reports that she continues to have hot flashes off and on for around 10 years and is wondering if she is still going through menopause.   Review of Systems  Constitutional: Positive for appetite change, chills and diaphoresis. Negative for fatigue and fever.  HENT: Positive for congestion, postnasal drip, rhinorrhea and voice change. Negative for ear pain, sneezing and sore throat.   Eyes: Positive for visual disturbance. Negative for pain and redness.  Respiratory: Negative for cough, shortness of breath and wheezing.   Cardiovascular: Negative  for chest pain and leg swelling.  Gastrointestinal: Positive for diarrhea. Negative for abdominal pain, blood in stool, constipation and nausea.  Endocrine: Positive for cold intolerance and heat intolerance. Negative for polydipsia and polyphagia.  Genitourinary: Negative.  Negative for dysuria, flank pain, hematuria, pelvic pain, vaginal bleeding and vaginal discharge.  Musculoskeletal: Positive for back pain, joint swelling and neck stiffness. Negative for arthralgias and gait problem.  Skin: Negative for rash.  Allergic/Immunologic: Positive for environmental allergies.  Neurological: Negative.  Negative for dizziness, tremors, seizures, weakness, light-headedness, numbness and headaches.  Hematological: Negative for adenopathy.  Psychiatric/Behavioral: Positive for sleep disturbance. Negative for behavioral problems, confusion and dysphoric mood. The patient is not nervous/anxious and is not hyperactive.     Social History      She  reports that she quit smoking about 31 years ago. Her smoking use included cigarettes. She has a 7.00 pack-year smoking history. She has never used smokeless tobacco. She reports current alcohol use. She reports that she does not use drugs.       Social History   Socioeconomic History  . Marital status: Married    Spouse name: Not on file  . Number of children: 3  . Years of education: Not on file  . Highest education level: Not on file  Occupational History  . Occupation: Pharmacist, hospital  Social Needs  . Financial resource strain: Not on file  . Food insecurity:    Worry: Not on file    Inability: Not on file  . Transportation needs:  Medical: Not on file    Non-medical: Not on file  Tobacco Use  . Smoking status: Former Smoker    Packs/day: 1.00    Years: 7.00    Pack years: 7.00    Types: Cigarettes    Last attempt to quit: 03/20/1986    Years since quitting: 31.9  . Smokeless tobacco: Never Used  Substance and Sexual Activity  . Alcohol use:  Yes    Alcohol/week: 0.0 standard drinks    Comment: 1-2 drinks per year  . Drug use: No  . Sexual activity: Not on file  Lifestyle  . Physical activity:    Days per week: Not on file    Minutes per session: Not on file  . Stress: Not on file  Relationships  . Social connections:    Talks on phone: Not on file    Gets together: Not on file    Attends religious service: Not on file    Active member of club or organization: Not on file    Attends meetings of clubs or organizations: Not on file    Relationship status: Not on file  Other Topics Concern  . Not on file  Social History Narrative  . Not on file    Past Medical History:  Diagnosis Date  . History of chicken pox   . History of measles   . History of mumps      Patient Active Problem List   Diagnosis Date Noted  . Irritable bowel syndrome 05/07/2015  . History of allergy 03/11/2015  . Anxiety 03/11/2015  . Arthritis 03/11/2015  . Chronic headache 03/11/2015  . Hypothyroidism, postop 03/11/2015  . Insomnia 03/11/2015  . Menopausal symptom 03/11/2015  . Osteopenia 03/11/2015  . Vitamin D deficiency 03/17/2011    Past Surgical History:  Procedure Laterality Date  . CESAREAN SECTION  1989  . [place]   x2  . SUPRACERVICAL ABDOMINAL HYSTERECTOMY  2003   due to Endometriosis and severe Anemia, Laproscopic; still has ovaries and cervix  . THYROIDECTOMY  06/2012   Dr. Jerral Bonito; due to multinodular goiter  . TONSILLECTOMY  1970    Family History        Family Status  Relation Name Status  . Mother  Alive  . Father  Deceased       Cause of Death: overdose; substance abuse  . Sister half sister Alive       substance abuse  . MGF  Deceased  . PGF  Deceased  . Sister  Alive  . Sister  Alive  . PGM  Deceased  . MGM  (Not Specified)  . Neg Hx  (Not Specified)        Her family history includes Alcohol abuse in her sister; Depression in her father; Diabetes in her mother;  Heart disease in her father, maternal grandfather, maternal grandmother, paternal grandfather, and paternal grandmother; Hypothyroidism in her mother; Stroke in her maternal grandfather. There is no history of Breast cancer.      Allergies  Allergen Reactions  . Avelox  [Moxifloxacin Hcl In Nacl]     Other reaction(s): Rapid pulse  . Iodine     Other reaction(s): Hives  . Penicillin G Rash     Current Outpatient Medications:  .  levothyroxine (SYNTHROID, LEVOTHROID) 100 MCG tablet, Take 1 tablet by mouth daily. Reported on 03/16/2015, Disp: , Rfl:  .  omeprazole (PRILOSEC) 40 MG capsule, Take 1 capsule (40 mg total) by mouth  daily., Disp: 90 capsule, Rfl: 3 .  sertraline (ZOLOFT) 50 MG tablet, TAKE 1 TABLET BY MOUTH EVERY DAY, Disp: 90 tablet, Rfl: 3   Patient Care Team: Birdie Sons, MD as PCP - General (Family Medicine) Margaretha Sheffield, MD (Otolaryngology)      Objective:   Vitals: BP (!) 142/100 (BP Location: Right Arm, Cuff Size: Large)   Pulse 69   Temp 97.6 F (36.4 C) (Oral)   Resp 16   Ht 5\' 3"  (1.6 m)   Wt 173 lb (78.5 kg)   LMP  (Within Years) Comment: 2003  SpO2 98% Comment: room air  BMI 30.65 kg/m    Vitals:   03/06/18 1017 03/06/18 1021  BP: (!) 140/92 (!) 142/100  Pulse: 69   Resp: 16   Temp: 97.6 F (36.4 C)   TempSrc: Oral   SpO2: 98%   Weight: 173 lb (78.5 kg)   Height: 5\' 3"  (1.6 m)      Physical Exam   General Appearance:    Alert, cooperative, no distress, appears stated age  Head:    Normocephalic, without obvious abnormality, atraumatic  Eyes:    PERRL, conjunctiva/corneas clear, EOM's intact, fundi    benign, both eyes  Ears:    Normal TM's and external ear canals, both ears  Nose:   Nares normal, septum midline, mucosa normal, no drainage    or sinus tenderness  Throat:   Lips, mucosa, and tongue normal; teeth and gums normal  Neck:   Supple, symmetrical, trachea midline, no adenopathy;    thyroid:  no  enlargement/tenderness/nodules; no carotid   bruit or JVD  Back:     Symmetric, no curvature, ROM normal, no CVA tenderness  Lungs:     Clear to auscultation bilaterally, respirations unlabored  Chest Wall:    No tenderness or deformity   Heart:    Regular rate and rhythm, S1 and S2 normal, no murmur, rub   or gallop  Breast Exam:    normal appearance, no masses or tenderness  Abdomen:     Soft, non-tender, bowel sounds active all four quadrants,    no masses, no organomegaly  Pelvic:    deferred  Extremities:   Extremities normal, atraumatic, no cyanosis or edema  Pulses:   2+ and symmetric all extremities  Skin:   Skin color, texture, turgor normal, no rashes or lesions  Lymph nodes:   Cervical, supraclavicular, and axillary nodes normal  Neurologic:   CNII-XII intact, normal strength, sensation and reflexes    throughout    Depression Screen PHQ 2/9 Scores 03/06/2018 03/15/2017 03/06/2016 03/16/2015  PHQ - 2 Score 0 0 0 0  PHQ- 9 Score 8 5 6 5       Assessment & Plan:     Routine Health Maintenance and Physical Exam  Exercise Activities and Dietary recommendations Goals   None     Immunization History  Administered Date(s) Administered  . Influenza-Unspecified 02/02/2015, 01/01/2017  . Tdap 03/10/2013    Health Maintenance  Topic Date Due  . Hepatitis C Screening  [birth year]  . HIV Screening  06/18/1977  . INFLUENZA VACCINE  10/19/2018 (Originally 10/18/2017)  . Fecal DNA (Cologuard)  03/21/2018  . MAMMOGRAM  04/18/2019  . PAP SMEAR-Modifier  03/15/2022  . TETANUS/TDAP  03/11/2023     Discussed health benefits of physical activity, and encouraged her to engage in regular exercise appropriate for her age and condition.    --------------------------------------------------------------------  1. Annual physical exam  - Comprehensive  metabolic panel - Lipid panel - CBC - TSH - VITAMIN D 25 Hydroxy (Vit-D Deficiency, Fractures) - FSH/LH - T4,  free Counseled she will be due for cologuard next month. Will send order before end of January.   2. BMI 30.0-30.9,adult Counseled regarding prudent diet and regular exercise.   3. Elevated BP without diagnosis of hypertension She is going to start having BP checked at work weekly, work on diet and exercise and notify me if SBM remains consistently > 140   4. Hypothyroidism, postop  - TSH - T4, free  5. Vitamin D deficiency Tolerated vitamin d replacement, but no longer taking.  - VITAMIN D 25 Hydroxy (Vit-D Deficiency, Fractures)  6. Menopausal symptom  - CBC - FSH/LH  7. Insomnia, unspecified type Takes rarely, but needs refill.  - ALPRAZolam (XANAX) 0.25 MG tablet; Take 1 tablet (0.25 mg total) by mouth at bedtime as needed for anxiety.  Dispense: 20 tablet; Refill: 1   Lelon Huh, MD  [facility]

## 2018-03-07 ENCOUNTER — Telehealth: Payer: Self-pay

## 2018-03-07 LAB — LIPID PANEL
CHOL/HDL RATIO: 3.4 ratio (ref 0.0–4.4)
Cholesterol, Total: 210 mg/dL — ABNORMAL HIGH (ref 100–199)
HDL: 61 mg/dL (ref >39)
LDL Calculated: 135 mg/dL — ABNORMAL HIGH (ref 0–99)
TRIGLYCERIDES: 72 mg/dL (ref 0–149)
VLDL Cholesterol Cal: 14 mg/dL (ref 5–40)

## 2018-03-07 LAB — CBC
HEMATOCRIT: 38.7 % (ref 34.0–46.6)
Hemoglobin: 12.9 g/dL (ref 11.1–15.9)
MCH: 31.7 pg (ref 26.6–33.0)
MCHC: 33.3 g/dL (ref 31.5–35.7)
MCV: 95 fL (ref 79–97)
Platelets: 228 10*3/uL (ref 150–450)
RBC: 4.07 x10E6/uL (ref 3.77–5.28)
RDW: 13.6 % (ref 12.3–15.4)
WBC: 6.2 10*3/uL (ref 3.4–10.8)

## 2018-03-07 LAB — COMPREHENSIVE METABOLIC PANEL
ALT: 20 IU/L (ref 0–32)
AST: 18 IU/L (ref 0–40)
Albumin/Globulin Ratio: 1.9 (ref 1.2–2.2)
Albumin: 4.4 g/dL (ref 3.5–5.5)
Alkaline Phosphatase: 94 IU/L (ref 39–117)
BUN/Creatinine Ratio: 10 (ref 9–23)
BUN: 7 mg/dL (ref 6–24)
Bilirubin Total: 0.5 mg/dL (ref 0.0–1.2)
CALCIUM: 9.1 mg/dL (ref 8.7–10.2)
CO2: 25 mmol/L (ref 20–29)
CREATININE: 0.7 mg/dL (ref 0.57–1.00)
Chloride: 101 mmol/L (ref 96–106)
GFR calc Af Amer: 113 mL/min/{1.73_m2} (ref >59)
GFR, EST NON AFRICAN AMERICAN: 98 mL/min/{1.73_m2} (ref >59)
GLUCOSE: 92 mg/dL (ref 65–99)
Globulin, Total: 2.3 g/dL (ref 1.5–4.5)
POTASSIUM: 3.9 mmol/L (ref 3.5–5.2)
Sodium: 141 mmol/L (ref 134–144)
Total Protein: 6.7 g/dL (ref 6.0–8.5)

## 2018-03-07 LAB — TSH: TSH: 2.13 u[IU]/mL (ref 0.450–4.500)

## 2018-03-07 LAB — T4, FREE: Free T4: 1.16 ng/dL (ref 0.82–1.77)

## 2018-03-07 LAB — FSH/LH
FSH: 70.5 m[IU]/mL
LH: 39.4 m[IU]/mL

## 2018-03-07 LAB — VITAMIN D 25 HYDROXY (VIT D DEFICIENCY, FRACTURES): Vit D, 25-Hydroxy: 12.7 ng/mL — ABNORMAL LOW (ref 30.0–100.0)

## 2018-03-07 NOTE — Telephone Encounter (Signed)
Pt advised.   Thanks,   -Onyinyechi Huante  

## 2018-03-07 NOTE — Telephone Encounter (Signed)
-----   Message from Birdie Sons, MD sent at 03/07/2018  7:51 AM EST ----- Vitamin d levels are very low. Need to start OTC vitamin D3 2000 units once every day.  Cholesterol is slightly high at 210. Avoid saturated fats in diet such as red meats.  Labs confirm she is post menopausal, may continue having hot flashes for a few years but should gradually go away. If they are very bothersome we could prescribe an estrogen pill.  Rest of labs, including thyroid functions are completely normal.

## 2018-03-14 ENCOUNTER — Other Ambulatory Visit: Payer: Self-pay | Admitting: Family Medicine

## 2018-04-11 ENCOUNTER — Telehealth: Payer: Self-pay | Admitting: Family Medicine

## 2018-04-11 DIAGNOSIS — Z1211 Encounter for screening for malignant neoplasm of colon: Secondary | ICD-10-CM

## 2018-04-11 NOTE — Telephone Encounter (Signed)
-----   Message from Birdie Sons, MD sent at 03/06/2018 12:25 PM EST ----- Regarding: order cologuard mid january

## 2018-04-30 ENCOUNTER — Telehealth: Payer: Self-pay | Admitting: Family Medicine

## 2018-04-30 NOTE — Telephone Encounter (Signed)
Notes are at the front desk.  Mr. Hyun advised.   Thanks,   -Mickel Baas

## 2018-04-30 NOTE — Telephone Encounter (Signed)
Pt's husband needing labs and notes from pt's CPE done on 03-15-17 and 03-06-18 He needs to turn this in for his insurance.  Please call husband when ready.  Thanks, [employer]

## 2018-05-05 LAB — COLOGUARD: Cologuard: NEGATIVE

## 2018-05-08 LAB — COLOGUARD: Cologuard: NEGATIVE

## 2018-05-09 ENCOUNTER — Encounter: Payer: Self-pay | Admitting: Family Medicine

## 2018-10-09 IMAGING — MG MM DIGITAL DIAGNOSTIC UNILAT*L* W/ TOMO W/ CAD
8 of 12 series · 8 of 24 positions shown · non-contrast
Comparison: Mammography 04/17/2017, 04/24/2016 and earlier.

CLINICAL DATA: Recall from screening mammography with
tomosynthesis, possible masses in the left breast, one of which was
only visible on the MLO view.

EXAM:
2D DIGITAL DIAGNOSTIC LEFT MAMMOGRAM WITH CAD AND ADJUNCT TOMO
ULTRASOUND LEFT BREAST

[L CC synth-2D]
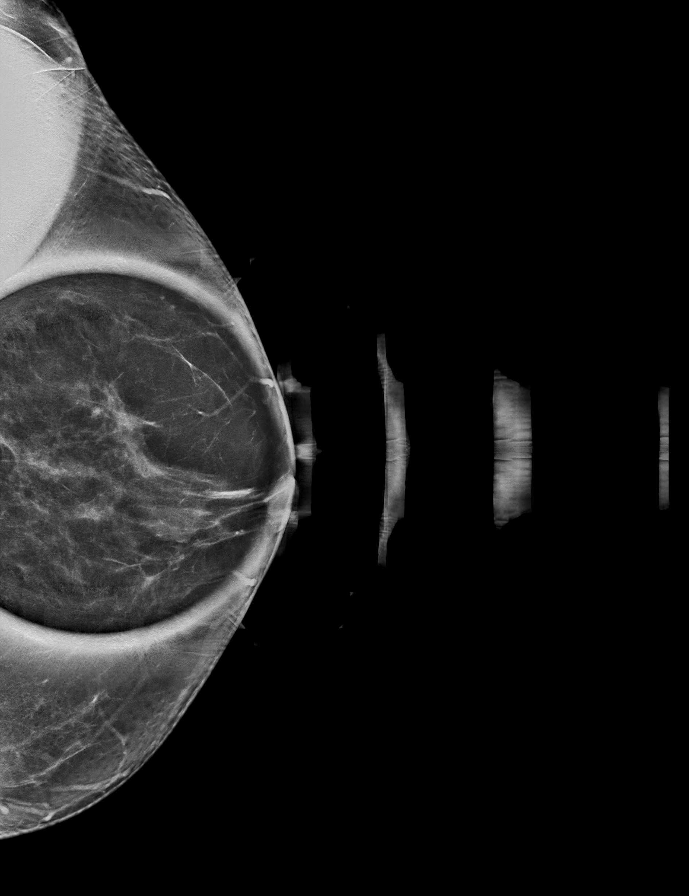

[L ML (1 of 2)]
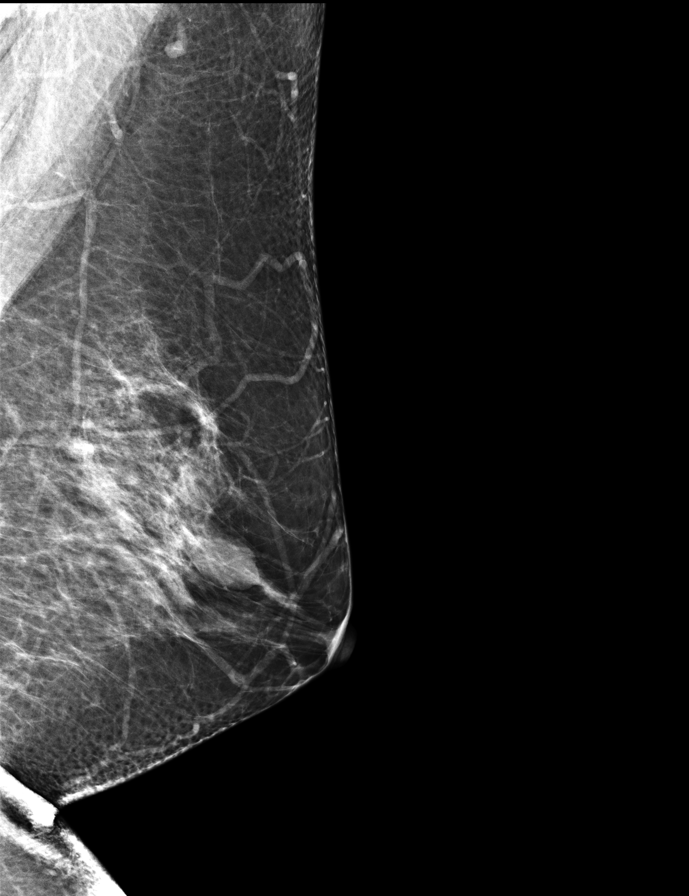

[L CC (1 of 2)]
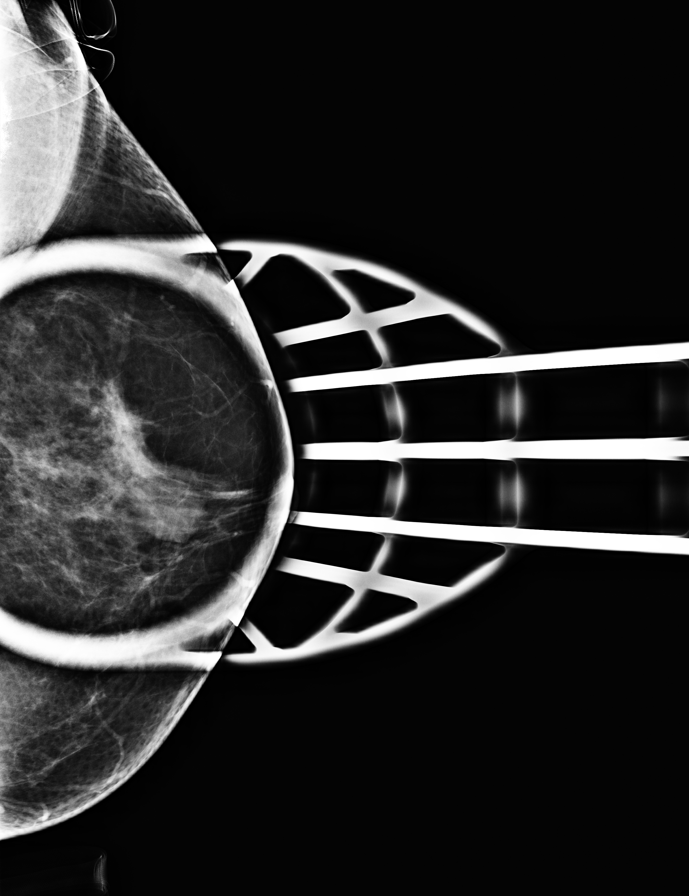

[L MLO synth-2D]
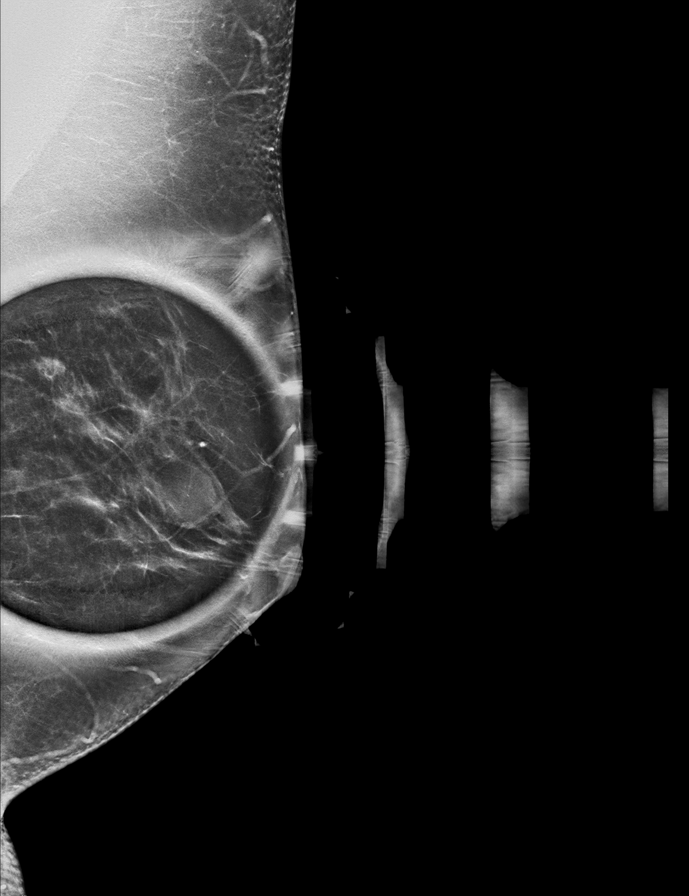

[L ML synth-2D]
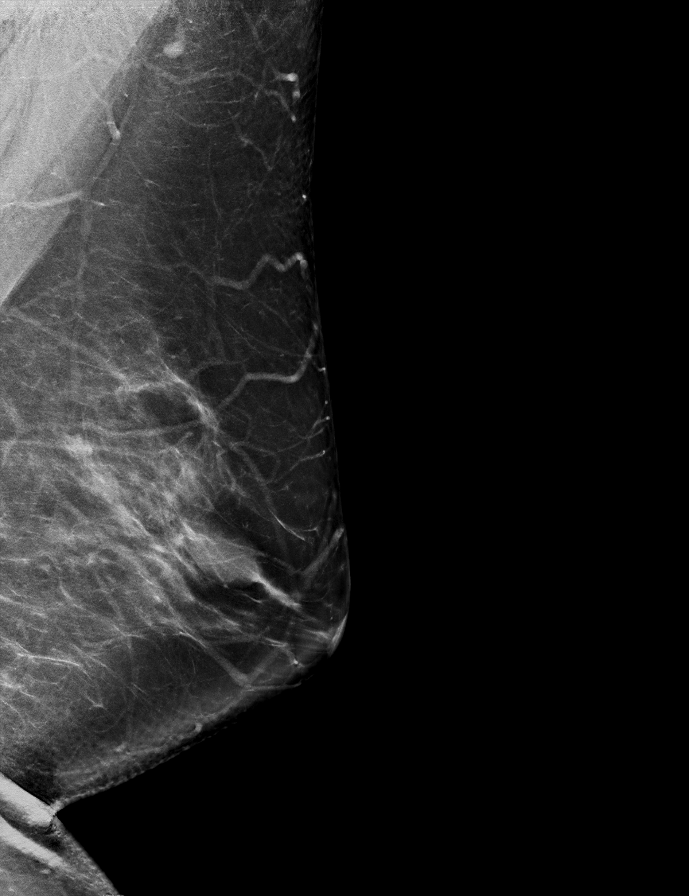

[L MLO]
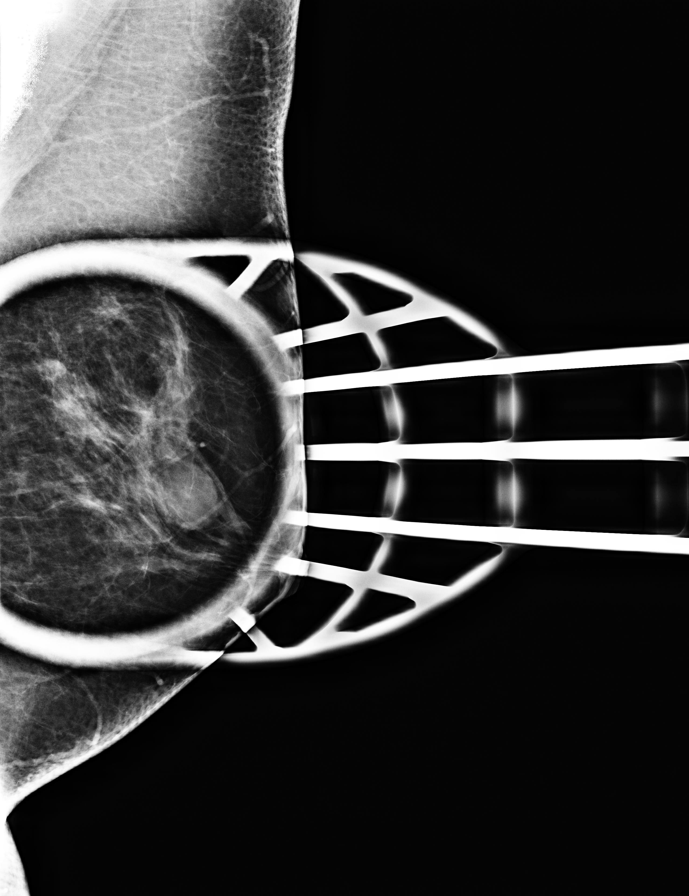

[L ML (2 of 2)]
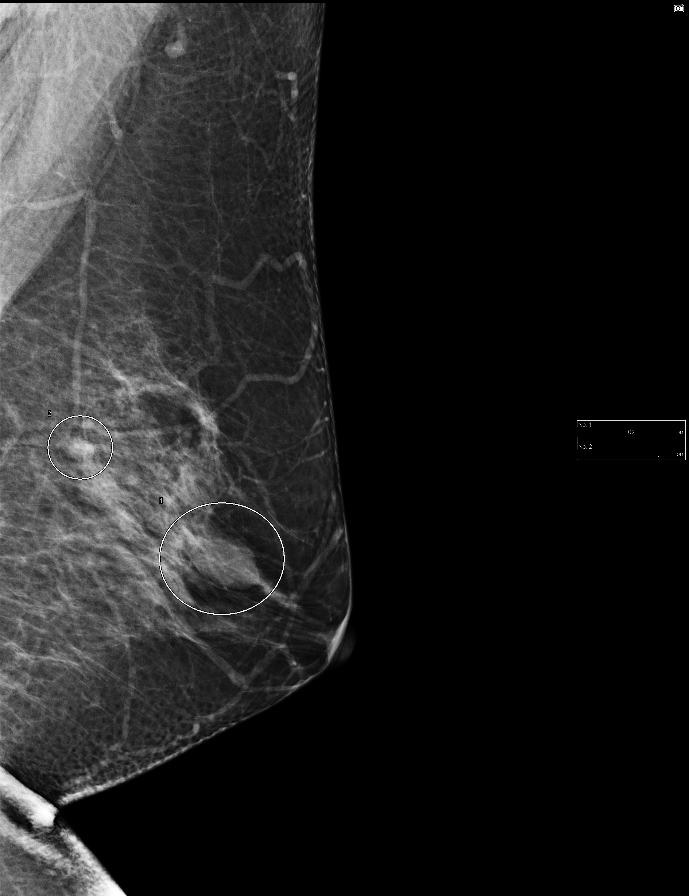

[L CC (2 of 2)]
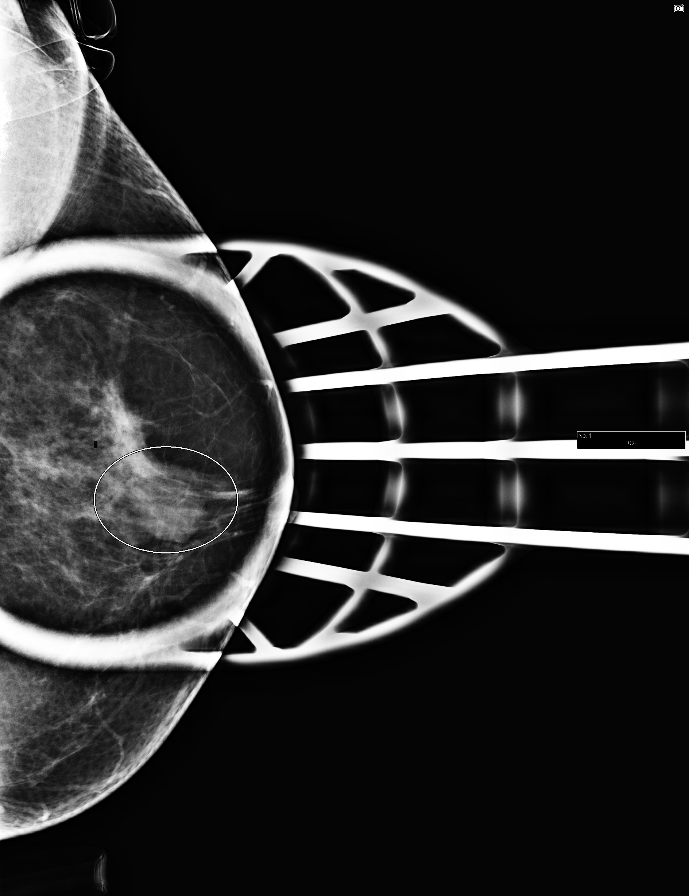

[8 of 24 positions shown; findings below may reference images not displayed]

Left
breast ultrasound 03/29/2015, 10/20/2010, 04/07/2010.

ACR Breast Density Category b: There are scattered areas of
fibroglandular density.
FINDINGS: Standard and tomosynthesis spot-compression CC and MLO views of the
areas of concern in the left breast and a standard 2D and
tomosynthesis full field mediolateral view of the left breast were
obtained.

Spot tomosynthesis images confirm a partially obscured low-density
mass in the retroareolar left breast at anterior depth measuring
between 1.5 and 2 cm. There is no associated architectural
distortion or suspicious calcifications.

The spot compression MLO views confirm a circumscribed medium
density mass in the upper left breast at posterior depth measuring
approximately 7 mm, with associated interspersed fat. There is no
associated architectural distortion or suspicious calcifications. On
the full field mediolateral view, the mass moves slightly inferiorly
relative to its location on the MLO view, indicating it is likely
located in the upper outer quadrant.

No suspicious findings elsewhere on the full field mediolateral
tomosynthesis images.

The full field mediolateral view was processed with CAD.

On physical exam, there is no palpable abnormality in the
retroareolar left breast or the upper outer quadrant of the left
breast.

Targeted left breast ultrasound is performed, showing an oval
circumscribed parallel anechoic mass at the 12 o'clock retroareolar
location measuring approximately 1.8 x 0.9 x 1.8 cm, demonstrating
posterior acoustic enhancement and no internal power Doppler flow,
corresponding to the screening mammographic finding.

At the 2 o'clock position approximately 4 cm from the nipple at
posterior depth is an oval circumscribed parallel anechoic mass
measuring approximately 0.6 x 0.4 x 0.5 cm, demonstrating posterior
acoustic enhancement and no internal power Doppler flow,
corresponding to the second screening mammographic finding.

No suspicious solid mass or abnormal acoustic shadowing is
identified.
IMPRESSION: 1. No mammographic or sonographic evidence of malignancy involving
the left breast.
2. Benign cysts in the retroareolar left breast at anterior depth
and in the upper outer quadrant at posterior depth account for the
screening mammographic findings.

RECOMMENDATION:
Screening mammogram in one year.(Code:9C-[id number])

I have discussed the findings and recommendations with the patient.
Results were also provided in writing at the conclusion of the
visit. If applicable, a reminder letter will be sent to the patient
regarding the next appointment.

BI-RADS CATEGORY  2: Benign.

## 2018-11-16 ENCOUNTER — Encounter: Payer: Self-pay | Admitting: Family Medicine

## 2018-12-21 ENCOUNTER — Other Ambulatory Visit: Payer: Self-pay | Admitting: Family Medicine

## 2019-01-21 ENCOUNTER — Other Ambulatory Visit: Payer: Self-pay | Admitting: Family Medicine

## 2019-01-30 ENCOUNTER — Other Ambulatory Visit: Payer: Self-pay

## 2019-01-30 ENCOUNTER — Encounter: Payer: Self-pay | Admitting: Family Medicine

## 2019-01-30 ENCOUNTER — Ambulatory Visit: Payer: BC Managed Care – PPO | Admitting: Family Medicine

## 2019-01-30 VITALS — BP 129/84 | HR 74 | Temp 97.3°F | Resp 16 | Wt 170.8 lb

## 2019-01-30 DIAGNOSIS — M545 Low back pain, unspecified: Secondary | ICD-10-CM

## 2019-01-30 MED ORDER — CYCLOBENZAPRINE HCL 10 MG PO TABS: 10.0000 mg | ORAL_TABLET | Freq: Three times a day (TID) | ORAL | 0 refills | Status: DC | PRN

## 2019-01-30 NOTE — Patient Instructions (Signed)
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
## 2019-02-05 ENCOUNTER — Telehealth: Payer: Self-pay

## 2019-02-05 MED ORDER — PREDNISONE 20 MG PO TABS: ORAL_TABLET | ORAL | 0 refills | Status: DC

## 2019-02-05 NOTE — Telephone Encounter (Signed)
LMTCB 02/05/2019.  Please advise as below when pt calls back.   Thanks,   -Mickel Baas

## 2019-02-05 NOTE — Telephone Encounter (Signed)
Recommend prednisone burst and taper. I will eRx if patient agrees.

## 2019-02-05 NOTE — Telephone Encounter (Signed)
Copied from [place] [id number]. Topic: General - Other >> Feb 05, 2019  2:54 PM Wynetta Emery, [place] C wrote: Reason for CRM: pt called in to be advised. Pt says that she was prescribed cyclobenzaprine (FLEXERIL) 10 MG tablet by Dr. Jacinto Reap. Pt says that medication isn't helping. The pain is very intense. Pt would like to be advised further.

## 2019-02-05 NOTE — Telephone Encounter (Signed)
Pt returned office call. Advised per provider. Pt agrees and would like to have sent to the pharmacy below.    Pharmacy:  [id number] - [place], [place] - [address] [phone] (Phone) [phone] (Fax)

## 2019-02-14 ENCOUNTER — Encounter: Payer: Self-pay | Admitting: Family Medicine

## 2019-02-17 ENCOUNTER — Telehealth: Payer: Self-pay

## 2019-02-17 NOTE — Telephone Encounter (Signed)
Patient rescheduled with Sharyn Lull on 02/18/2019.

## 2019-02-17 NOTE — Telephone Encounter (Signed)
Copied from [place] [id number]. Topic: Appointment Scheduling - Scheduling Inquiry for Clinic >> Feb 17, 2019  2:24 PM Scherrie Gerlach wrote: Reason for CRM: pt has appt thurs at 4 pm for back pain.  Requesting earlier appt if possible

## 2019-02-17 NOTE — Progress Notes (Addendum)
Patient: Lisa Simmons Female    DOB: 1961/01/06   58 y.o.   MRN: [id number] Visit Date: 02/18/2019  Today's Provider: Marcille Buffy, FNP   Chief Complaint  Patient presents with  . Back Pain   Subjective:     HPI  Patient reports that this back pain started 3 weeks agoShe was seen November 6th - treated for muscle spasm. Marland Kitchen She was seen for this 3 weeks ago. She was prescribed Flexeril but stopped it this past Saturday because it was not feeling any better.  She finished the Prednisone but it didn't help. .She reports that she had a really bad spell on Thanksgiving that she felt faint from the pain. Reports that pain it was from her waist down to her lower abdomen into her right groin and that same night she reports that she got up to go urinate and it just came due to urinary pressure.  She reports that the pain was intermittent and felt like it was moving on the right side of her lower back.  This only has happened once the day of Thanksgiving. Onset- October 30th.- around . Denies any injury.   She has a history of diverticulitis 05/18/2017.  She reports she felt faint for a  second she was conscious during that time hearing everyone- she has not ever done that before not since that initial episode. Reports her Flexeril and prednisone did not relieve her symptoms. She denies any loss of bladder control. She reports she has abdominal pressure/ lower back.   She reports that from the initial onset of this that her symptoms have improved substantially, she is no longer taking ibuprofen and reports her pain a 2 out of 10 and describes it more as soreness on the right lower side. She reports bowel movements are normal, denies constipation denies rectal bleeding. Nausea vomiting. Patient  denies any fever, body aches,chills, rash, chest pain, shortness of breath, nausea, vomiting, or diarrhea.  She tried Ibuprofen since last Thursday. She has improved since Thursday.    Denies any saddle paresthesia. History of UTI's. Use to be on macrobid all the time.   Patient  denies any fever, body aches,chills, rash, chest pain, shortness of breath, nausea, vomiting, or diarrhea.  No menstrual hysterectomy - full.   Allergies  Allergen Reactions  . Avelox  [Moxifloxacin Hcl In Nacl]     Other reaction(s): Rapid pulse  . Iodine     Other reaction(s): Hives  . Penicillin G Rash     Current Outpatient Medications:  .  ALPRAZolam (XANAX) 0.25 MG tablet, Take 1 tablet (0.25 mg total) by mouth at bedtime as needed for anxiety., Disp: 20 tablet, Rfl: 1 .  levothyroxine (SYNTHROID) 88 MCG tablet, Take 88 mcg by mouth daily., Disp: , Rfl:  .  omeprazole (PRILOSEC) 40 MG capsule, TAKE 1 CAPSULE BY MOUTH EVERY DAY (Patient taking differently: Take 40 mg by mouth as needed. ), Disp: 90 capsule, Rfl: 4 .  sertraline (ZOLOFT) 50 MG tablet, TAKE 1 TABLET BY MOUTH EVERY DAY, Disp: 90 tablet, Rfl: 1 .  cyclobenzaprine (FLEXERIL) 10 MG tablet, Take 1 tablet (10 mg total) by mouth 3 (three) times daily as needed for muscle spasms. (Patient not taking: Reported on 02/18/2019), Disp: 30 tablet, Rfl: 0  Review of Systems  Constitutional: Negative.   HENT: Negative.   Respiratory: Negative.   Cardiovascular: Negative for chest pain, palpitations and leg swelling.  Gastrointestinal: Positive for abdominal pain.  Negative for abdominal distention, anal bleeding, blood in stool, constipation, diarrhea, nausea, rectal pain and vomiting.  Musculoskeletal: Positive for arthralgias and back pain. Negative for gait problem, joint swelling, myalgias, neck pain and neck stiffness.  Skin: Negative for rash.  Neurological: Positive for headaches.  Psychiatric/Behavioral: Negative.     Social History   Tobacco Use  . Smoking status: Former Smoker    Packs/day: 1.00    Years: 7.00    Pack years: 7.00    Types: Cigarettes    Quit date: 03/20/1986    Years since quitting: 32.9  . Smokeless  tobacco: Never Used  Substance Use Topics  . Alcohol use: Yes    Alcohol/week: 0.0 standard drinks    Comment: 1-2 drinks per year      Objective:   BP 120/82 (BP Location: Left Arm, Patient Position: Sitting, Cuff Size: Large)   Pulse 90   Temp (!) 97.1 F (36.2 C) (Temporal)   Resp 16   LMP 03/16/2015   SpO2 96%  Vitals:   02/18/19 1055  BP: 120/82  Pulse: 90  Resp: 16  Temp: (!) 97.1 F (36.2 C)  TempSrc: Temporal  SpO2: 96%  There is no height or weight on file to calculate BMI.   Physical Exam Constitutional:      General: She is not in acute distress.    Appearance: Normal appearance. She is not ill-appearing, toxic-appearing or diaphoretic.  Neck:     Musculoskeletal: Normal range of motion and neck supple.  Cardiovascular:     Rate and Rhythm: Normal rate and regular rhythm.     Heart sounds: No murmur. No friction rub. No gallop.   Pulmonary:     Effort: Pulmonary effort is normal.     Breath sounds: Normal breath sounds.  Abdominal:     General: Abdomen is flat. Bowel sounds are normal.     Palpations: Abdomen is soft. There is no shifting dullness or pulsatile mass.     Tenderness: There is abdominal tenderness in the suprapubic area. There is no right CVA tenderness, left CVA tenderness, guarding or rebound. Negative signs include Murphy's sign, McBurney's sign and psoas sign.    Musculoskeletal: Normal range of motion.        General: No tenderness.     Right hip: Normal.     Left hip: Normal.     Lumbar back: Normal. She exhibits normal range of motion, no tenderness, no bony tenderness, no swelling, no edema, no deformity, no laceration, no pain, no spasm and normal pulse.     Right upper leg: Normal.     Left upper leg: Normal.  Lymphadenopathy:     Head:     Right side of head: No submental, submandibular, tonsillar, preauricular, posterior auricular or occipital adenopathy.     Left side of head: No submental, submandibular, tonsillar,  preauricular, posterior auricular or occipital adenopathy.     Cervical: No cervical adenopathy.  Skin:    General: Skin is warm and dry.     Capillary Refill: Capillary refill takes less than 2 seconds.  Neurological:     Mental Status: She is alert and oriented to person, place, and time.  Psychiatric:        Attention and Perception: Attention normal.        Mood and Affect: Mood normal.        Speech: Speech normal.        Behavior: Behavior normal. Behavior is cooperative.  Thought Content: Thought content normal.        Cognition and Memory: Cognition normal.        Judgment: Judgment normal.      Results for orders placed or performed in visit on 02/18/19  POCT urinalysis dipstick  Result Value Ref Range   Color, UA Yellow    Clarity, UA Cloudy    Glucose, UA Negative Negative   Bilirubin, UA Negative    Ketones, UA Negative    Spec Grav, UA 1.010 1.010 - 1.025   Blood, UA Trace    pH, UA 6.5 5.0 - 8.0   Protein, UA Negative Negative   Urobilinogen, UA 0.2 0.2 or 1.0 E.U./dL   Nitrite, UA Negative    Leukocytes, UA Moderate (2+) (A) Negative   Appearance     Odor         Assessment & Plan     Acute bilateral low back pain without sciatica - Plan: POCT urinalysis dipstick, Urine Culture, DG Lumbar Spine Complete  Urinary tract infection with hematuria, site unspecified - Plan: DG Lumbar Spine Complete  Will rule out lumbar involvement with lumbar x ray given duration of symptoms. Suspect patient developed kidney stone, has small amount of hematuria and moderate leukocytes in the urine today. Suprapubic tenderness. No CVA tenderness. Pain as improved since onset. Will treat for Urinary tract infection.   Meds ordered this encounter  Medications  . sulfamethoxazole-trimethoprim (BACTRIM DS) 800-160 MG tablet    Sig: Take 1 tablet by mouth 2 (two) times daily.    Dispense:  10 tablet    Refill:  0   Also discussed RED FLAGS and signs of kidney stone's/  RED Flags for back and abdominal pain discussed.   Orders Placed This Encounter  Procedures  . Urine Culture  . DG Lumbar Spine Complete    Standing Status:   Future    Standing Expiration Date:   03/21/2019    Order Specific Question:   Reason for Exam (SYMPTOM  OR DIAGNOSIS REQUIRED)    Answer:   lower back pain.    Order Specific Question:   Is patient pregnant?    Answer:   No    Comments:   hysterectomy.     Order Specific Question:   Preferred imaging location?    Answer:   [facility]    Order Specific Question:   Radiology Contrast Protocol - do NOT remove file path    Answer:   [email or web address]  . POCT urinalysis dipstick    Return in about 1 week (around 02/25/2019), or if symptoms worsen or fail to improve, for Go to Emergency room/ urgent care if worse.  The entirety of the information documented in the History of Present Illness, Review of Systems and Physical Exam were personally obtained by me. Portions of this information were initially documented by the  Certified Medical Assistant whose name is documented in [place] and reviewed by me for thoroughness and accuracy.  I have personally performed the exam and reviewed the chart and it is accurate to the best of my knowledge.  Haematologist has been used and any errors in dictation or transcription are unintentional.  Kelby Aline. [place], [facility]

## 2019-02-18 ENCOUNTER — Encounter: Payer: Self-pay | Admitting: Adult Health

## 2019-02-18 ENCOUNTER — Other Ambulatory Visit: Payer: Self-pay

## 2019-02-18 ENCOUNTER — Ambulatory Visit: Payer: BC Managed Care – PPO | Admitting: Adult Health

## 2019-02-18 DIAGNOSIS — N390 Urinary tract infection, site not specified: Secondary | ICD-10-CM

## 2019-02-18 DIAGNOSIS — M545 Low back pain, unspecified: Secondary | ICD-10-CM

## 2019-02-18 DIAGNOSIS — R319 Hematuria, unspecified: Secondary | ICD-10-CM

## 2019-02-18 LAB — POCT URINALYSIS DIPSTICK
Bilirubin, UA: NEGATIVE
Glucose, UA: NEGATIVE
Ketones, UA: NEGATIVE
Nitrite, UA: NEGATIVE
Protein, UA: NEGATIVE
Spec Grav, UA: 1.01 (ref 1.010–1.025)
Urobilinogen, UA: 0.2 E.U./dL
pH, UA: 6.5 (ref 5.0–8.0)

## 2019-02-18 MED ORDER — SULFAMETHOXAZOLE-TRIMETHOPRIM 800-160 MG PO TABS: 1.0000 | ORAL_TABLET | Freq: Two times a day (BID) | ORAL | 0 refills | Status: DC

## 2019-02-18 NOTE — Patient Instructions (Signed)
Acute Back Pain, Adult Acute back pain is sudden and usually short-lived. It is often caused by an injury to the muscles and tissues in the back. The injury may result from:  A muscle or ligament getting overstretched or torn (strained). Ligaments are tissues that connect bones to each other. Lifting something improperly can cause a back strain.  Wear and tear (degeneration) of the spinal disks. Spinal disks are circular tissue that provides cushioning between the bones of the spine (vertebrae).  Twisting motions, such as while playing sports or doing yard work.  A hit to the back.  Arthritis. You may have a physical exam, lab tests, and imaging tests to find the cause of your pain. Acute back pain usually goes away with rest and home care. Follow these instructions at home: Managing pain, stiffness, and swelling  Take over-the-counter and prescription medicines only as told by your health care provider.  Your health care provider may recommend applying ice during the first 24-48 hours after your pain starts. To do this: ? Put ice in a plastic bag. ? Place a towel between your skin and the bag. ? Leave the ice on for 20 minutes, 2-3 times a day.  If directed, apply heat to the affected area as often as told by your health care provider. Use the heat source that your health care provider recommends, such as a moist heat pack or a heating pad. ? Place a towel between your skin and the heat source. ? Leave the heat on for 20-30 minutes. ? Remove the heat if your skin turns bright red. This is especially important if you are unable to feel pain, heat, or cold. You have a greater risk of getting burned. Activity   Do not stay in bed. Staying in bed for more than 1-2 days can delay your recovery.  Sit up and stand up straight. Avoid leaning forward when you sit, or hunching over when you stand. ? If you work at a desk, sit close to it so you do not need to lean over. Keep your chin tucked  in. Keep your neck drawn back, and keep your elbows bent at a right angle. Your arms should look like the letter "L." ? Sit high and close to the steering wheel when you drive. Add lower back (lumbar) support to your car seat, if needed.  Take short walks on even surfaces as soon as you are able. Try to increase the length of time you walk each day.  Do not sit, drive, or stand in one place for more than 30 minutes at a time. Sitting or standing for long periods of time can put stress on your back.  Do not drive or use heavy machinery while taking prescription pain medicine.  Use proper lifting techniques. When you bend and lift, use positions that put less stress on your back: ? [place] your knees. ? Keep the load close to your body. ? Avoid twisting.  Exercise regularly as told by your health care provider. Exercising helps your back heal faster and helps prevent back injuries by keeping muscles strong and flexible.  Work with a physical therapist to make a safe exercise program, as recommended by your health care provider. Do any exercises as told by your physical therapist. Lifestyle  Maintain a healthy weight. Extra weight puts stress on your back and makes it difficult to have good posture.  Avoid activities or situations that make you feel anxious or stressed. Stress and anxiety increase muscle  tension and can make back pain worse. Learn ways to manage anxiety and stress, such as through exercise. General instructions  Sleep on a firm mattress in a comfortable position. Try lying on your side with your knees slightly bent. If you lie on your back, put a pillow under your knees.  Follow your treatment plan as told by your health care provider. This may include: ? Cognitive or behavioral therapy. ? Acupuncture or massage therapy. ? Meditation or yoga. Contact a health care provider if:  You have pain that is not relieved with rest or medicine.  You have increasing pain going down  into your legs or buttocks.  Your pain does not improve after 2 weeks.  You have pain at night.  You lose weight without trying.  You have a fever or chills. Get help right away if:  You develop new bowel or bladder control problems.  You have unusual weakness or numbness in your arms or legs.  You develop nausea or vomiting.  You develop abdominal pain.  You feel faint. Summary  Acute back pain is sudden and usually short-lived.  Use proper lifting techniques. When you bend and lift, use positions that put less stress on your back.  Take over-the-counter and prescription medicines and apply heat or ice as directed by your health care provider. This information is not intended to replace advice given to you by your health care provider. Make sure you discuss any questions you have with your health care provider. Document Released: 03/06/2005 Document Revised: 06/25/2018 Document Reviewed: 10/18/2016 Elsevier Patient Education  2020 [place]. Urinary Tract Infection, Adult A urinary tract infection (UTI) is an infection of any part of the urinary tract. The urinary tract includes:  The kidneys.  The ureters.  The bladder.  The urethra. These organs make, store, and get rid of pee (urine) in the body. What are the causes? This is caused by germs (bacteria) in your genital area. These germs grow and cause swelling (inflammation) of your urinary tract. What increases the risk? You are more likely to develop this condition if:  You have a small, thin tube (catheter) to drain pee.  You cannot control when you pee or poop (incontinence).  You are female, and: ? You use these methods to prevent pregnancy: ? A medicine that kills sperm (spermicide). ? A device that blocks sperm (diaphragm). ? You have low levels of a female hormone (estrogen). ? You are pregnant.  You have genes that add to your risk.  You are sexually active.  You take antibiotic  medicines.  You have trouble peeing because of: ? A prostate that is bigger than normal, if you are female. ? A blockage in the part of your body that drains pee from the bladder (urethra). ? A kidney stone. ? A nerve condition that affects your bladder (neurogenic bladder). ? Not getting enough to drink. ? Not peeing often enough.  You have other conditions, such as: ? Diabetes. ? A weak disease-fighting system (immune system). ? Sickle cell disease. ? Gout. ? Injury of the spine. What are the signs or symptoms? Symptoms of this condition include:  Needing to pee right away (urgently).  Peeing often.  Peeing small amounts often.  Pain or burning when peeing.  Blood in the pee.  Pee that smells bad or not like normal.  Trouble peeing.  Pee that is cloudy.  Fluid coming from the vagina, if you are female.  Pain in the belly or lower back.  Other symptoms include:  Throwing up (vomiting).  No urge to eat.  Feeling mixed up (confused).  Being tired and grouchy (irritable).  A fever.  Watery poop (diarrhea). How is this treated? This condition may be treated with:  Antibiotic medicine.  Other medicines.  Drinking enough water. Follow these instructions at home:  Medicines  Take over-the-counter and prescription medicines only as told by your doctor.  If you were prescribed an antibiotic medicine, take it as told by your doctor. Do not stop taking it even if you start to feel better. General instructions  Make sure you: ? Pee until your bladder is empty. ? Do not hold pee for a long time. ? Empty your bladder after sex. ? Wipe from front to back after pooping if you are a female. Use each tissue one time when you wipe.  Drink enough fluid to keep your pee pale yellow.  Keep all follow-up visits as told by your doctor. This is important. Contact a doctor if:  You do not get better after 1-2 days.  Your symptoms go away and then come back. Get  help right away if:  You have very bad back pain.  You have very bad pain in your lower belly.  You have a fever.  You are sick to your stomach (nauseous).  You are throwing up. Summary  A urinary tract infection (UTI) is an infection of any part of the urinary tract.  This condition is caused by germs in your genital area.  There are many risk factors for a UTI. These include having a small, thin tube to drain pee and not being able to control when you pee or poop.  Treatment includes antibiotic medicines for germs.  Drink enough fluid to keep your pee pale yellow. This information is not intended to replace advice given to you by your health care provider. Make sure you discuss any questions you have with your health care provider. Document Released: 08/23/2007 Document Revised: 02/21/2018 Document Reviewed: 09/13/2017 Elsevier Patient Education  [place]. Sulfamethoxazole; Trimethoprim, SMX-TMP tablets What is this medicine? SULFAMETHOXAZOLE; TRIMETHOPRIM or SMX-TMP (suhl fuh meth OK suh zohl; trye METH oh prim) is a combination of a sulfonamide antibiotic and a second antibiotic, trimethoprim. It is used to treat or prevent certain kinds of bacterial infections. It will not work for colds, flu, or other viral infections. This medicine may be used for other purposes; ask your health care provider or pharmacist if you have questions. COMMON BRAND NAME(S): Bacter-Aid DS, Bactrim, Bactrim DS, Septra, Septra DS What should I tell my health care provider before I take this medicine? They need to know if you have any of these conditions:  anemia  asthma  being treated with anticonvulsants  if you frequently drink alcohol containing drinks  kidney disease  liver disease  low level of folic acid or [id number] dehydrogenase  poor nutrition or malabsorption  porphyria  severe allergies  thyroid disorder  an unusual or allergic reaction to  sulfamethoxazole, trimethoprim, sulfa drugs, other medicines, foods, dyes, or preservatives  pregnant or trying to get pregnant  breast-feeding How should I use this medicine? Take this medicine by mouth with a full glass of water. Follow the directions on the prescription label. Take your medicine at regular intervals. Do not take it more often than directed. Do not skip doses or stop your medicine early. Talk to your pediatrician regarding the use of this medicine in children. Special care may be needed. This medicine  has been used in children as young as 60 months of age. Overdosage: If you think you have taken too much of this medicine contact a poison control center or emergency room at once. NOTE: This medicine is only for you. Do not share this medicine with others. What if I miss a dose? If you miss a dose, take it as soon as you can. If it is almost time for your next dose, take only that dose. Do not take double or extra doses. What may interact with this medicine? Do not take this medicine with any of the following medications:  aminobenzoate potassium  dofetilide  metronidazole This medicine may also interact with the following medications:  ACE inhibitors like benazepril, enalapril, lisinopril, and ramipril  birth control pills  cyclosporine  digoxin  diuretics  indomethacin  medicines for diabetes  methenamine  methotrexate  phenytoin  potassium supplements  pyrimethamine  sulfinpyrazone  tricyclic antidepressants  warfarin This list may not describe all possible interactions. Give your health care provider a list of all the medicines, herbs, non-prescription drugs, or dietary supplements you use. Also tell them if you smoke, drink alcohol, or use illegal drugs. Some items may interact with your medicine. What should I watch for while using this medicine? Tell your doctor or health care professional if your symptoms do not improve. Drink several  glasses of water a day to reduce the risk of kidney problems. Do not treat diarrhea with over the counter products. Contact your doctor if you have diarrhea that lasts more than 2 days or if it is severe and watery. This medicine can make you more sensitive to the sun. Keep out of the sun. If you cannot avoid being in the sun, wear protective clothing and use a sunscreen. Do not use sun lamps or tanning beds/booths. What side effects may I notice from receiving this medicine? Side effects that you should report to your doctor or health care professional as soon as possible:  allergic reactions like skin rash or hives, swelling of the face, lips, or tongue  breathing problems  fever or chills, sore throat  irregular heartbeat, chest pain  joint or muscle pain  pain or difficulty passing urine  red pinpoint spots on skin  redness, blistering, peeling or loosening of the skin, including inside the mouth  unusual bleeding or bruising  unusually weak or tired  yellowing of the eyes or skin Side effects that usually do not require medical attention (report to your doctor or health care professional if they continue or are bothersome):  diarrhea  dizziness  headache  loss of appetite  nausea, vomiting  nervousness This list may not describe all possible side effects. Call your doctor for medical advice about side effects. You may report side effects to FDA at 1-800-FDA-1088. Where should I keep my medicine? Keep out of the reach of children. Store at room temperature between 20 to 25 degrees C (68 to 77 degrees F). Protect from light. Throw away any unused medicine after the expiration date. NOTE: This sheet is a summary. It may not cover all possible information. If you have questions about this medicine, talk to your doctor, pharmacist, or health care provider.  2020 Elsevier/Gold Standard (2012-10-11 14:38:26)

## 2019-02-18 NOTE — Progress Notes (Signed)
Sent for culture prescribed Bactrim

## 2019-02-19 ENCOUNTER — Ambulatory Visit
Admission: RE | Admit: 2019-02-19 | Discharge: 2019-02-19 | Disposition: A | Discharge status: Home / Self Care | Payer: BC Managed Care – PPO | Attending: Adult Health | Admitting: Adult Health

## 2019-02-19 ENCOUNTER — Ambulatory Visit
Admission: RE | Admit: 2019-02-19 | Discharge: 2019-02-19 | Disposition: A | Discharge status: Home / Self Care | Payer: BC Managed Care – PPO | Source: Ambulatory Visit | Attending: Adult Health | Admitting: Adult Health

## 2019-02-19 ENCOUNTER — Other Ambulatory Visit: Payer: Self-pay

## 2019-02-19 DIAGNOSIS — R319 Hematuria, unspecified: Secondary | ICD-10-CM | POA: Diagnosis present

## 2019-02-19 DIAGNOSIS — M545 Low back pain, unspecified: Secondary | ICD-10-CM

## 2019-02-19 DIAGNOSIS — N390 Urinary tract infection, site not specified: Secondary | ICD-10-CM | POA: Diagnosis present

## 2019-02-20 ENCOUNTER — Ambulatory Visit: Payer: Self-pay | Admitting: Family Medicine

## 2019-02-20 ENCOUNTER — Encounter: Payer: Self-pay | Admitting: Adult Health

## 2019-02-21 ENCOUNTER — Other Ambulatory Visit: Payer: Self-pay | Admitting: Adult Health

## 2019-02-21 ENCOUNTER — Encounter: Payer: Self-pay | Admitting: Adult Health

## 2019-02-21 ENCOUNTER — Telehealth: Payer: Self-pay

## 2019-02-21 LAB — URINE CULTURE

## 2019-02-21 MED ORDER — SULFAMETHOXAZOLE-TRIMETHOPRIM 800-160 MG PO TABS: 1.0000 | ORAL_TABLET | Freq: Two times a day (BID) | ORAL | 0 refills | Status: DC

## 2019-02-21 NOTE — Progress Notes (Signed)
Will you please let patient know that her urine culture resulted with over 100,000 colonies of Staphylococcus epidermis on  culture and sensitivity shows she is on the correct antibiotic, Bactrim DS, I originally sent in 5 days, I have sent in an additional 5 days for a total of 10 days of treatment given the duration of her symptoms. If she has any persistent symptoms or symptoms do not resolve follow back up with the office as we previously discussed. ED for emergent symptoms.  Thanks, Laverna Peace MSN, AGNP-C, FNP-C

## 2019-02-21 NOTE — Telephone Encounter (Signed)
Patient was advised. KW 

## 2019-02-21 NOTE — Telephone Encounter (Signed)
Copied from [place] [id number]. Topic: General - Other >> Feb 21, 2019 12:35 PM Greggory Keen D wrote: Reason for CRM: pt's husband called wanting to get a copy of his wifes last three physicals and labs for wellness insurance.  He will come by and pick them up.  Please call when ready [phone]

## 2019-02-21 NOTE — Telephone Encounter (Signed)
-----   Message from Doreen Beam, [place] sent at 02/21/2019 11:38 AM EST ----- Will you please let patient know that her urine culture resulted with over 100,000 colonies of Staphylococcus epidermis on  culture and sensitivity shows she is on the correct antibiotic, Bactrim DS, I originally sent in 5 days, I have sent in an additional 5 days for a total of 10 days of treatment given the duration of her symptoms. If she has any persistent symptoms or symptoms do not resolve follow back up with the office as we previously discussed. ED for emergent symptoms.  Thanks, Laverna Peace MSN, AGNP-C, FNP-C

## 2019-03-07 NOTE — Telephone Encounter (Signed)
Spoke with pt and she will get requested records via mychart after her CPE later this month. Thanks TNP

## 2019-03-10 ENCOUNTER — Telehealth: Payer: Self-pay

## 2019-03-10 NOTE — Telephone Encounter (Signed)
FYI. Patient was prescreened. KW

## 2019-03-10 NOTE — Telephone Encounter (Signed)
Copied from [place] [id number]. Topic: Clinical - COVID Pre-Screen >> Mar 10, 2019 12:20 PM Alanda Slim E wrote: 1. To the best of your knowledge, have you been in close contact with anyone with a confirmed diagnosis of COVID 19?  no  If no - Proceed to next question; If yes - Schedule patient for a virtual visit  2. Have you had any one or more of the following: fever, chills, cough, shortness of breath or any flu-like symptoms?  no  If no - Proceed to next question; If yes - Schedule patient for a virtual visit  3. Have you been diagnosed with or have a previous diagnosis of COVID 19?  no  If no - Proceed to next question; If yes - Schedule patient for a virtual visit  4. I am going to go over a few other symptoms with you. Please let me know if you are experiencing any of the following: no  Ear, nose or throat discomfort  A sore throat  Headache  Muscle pain  Diarrhea  Loss of taste or smell  If no - Continue with scheduling process; If yes - Document in scheduling notes   Thank you for answering these questions. Please know we will ask you these questions or similar questions when you arrive for your appointment and again it's how we are keeping everyone safe. Also, to keep you safe, please use the provided hand sanitizer when you enter the building. Lucita Lora, we are asking everyone in the building to wear a mask because they help [place] prevent the spread of germs.   Do you have a mask of your own, if not, we are happy to provide one for you. The last thing I want to go over with you is the no visitor guidelines. This means no one can attend the appointment with you unless you need physical assistance. I understand this may be different from your past appointments and I know this may be difficult but please know if someone is driving you we are happy to call them for you once your appointment is over.

## 2019-03-11 ENCOUNTER — Ambulatory Visit (INDEPENDENT_AMBULATORY_CARE_PROVIDER_SITE_OTHER): Payer: BC Managed Care – PPO | Admitting: Physician Assistant

## 2019-03-11 ENCOUNTER — Other Ambulatory Visit: Payer: Self-pay

## 2019-03-11 ENCOUNTER — Encounter: Payer: Self-pay | Admitting: Physician Assistant

## 2019-03-11 VITALS — BP 121/81 | HR 80 | Temp 96.9°F | Resp 16 | Ht 63.0 in | Wt 173.0 lb

## 2019-03-11 DIAGNOSIS — E559 Vitamin D deficiency, unspecified: Secondary | ICD-10-CM

## 2019-03-11 DIAGNOSIS — Z23 Encounter for immunization: Secondary | ICD-10-CM | POA: Diagnosis not present

## 2019-03-11 DIAGNOSIS — Z1231 Encounter for screening mammogram for malignant neoplasm of breast: Secondary | ICD-10-CM

## 2019-03-11 DIAGNOSIS — Z0000 Encounter for general adult medical examination without abnormal findings: Secondary | ICD-10-CM

## 2019-03-11 DIAGNOSIS — F419 Anxiety disorder, unspecified: Secondary | ICD-10-CM | POA: Diagnosis not present

## 2019-03-11 NOTE — Patient Instructions (Signed)
Health Maintenance, Female Adopting a healthy lifestyle and getting preventive care are important in promoting health and wellness. Ask your health care provider about:  The right schedule for you to have regular tests and exams.  Things you can do on your own to prevent diseases and keep yourself healthy. What should I know about diet, weight, and exercise? Eat a healthy diet   Eat a diet that includes plenty of vegetables, fruits, low-fat dairy products, and lean protein.  Do not eat a lot of foods that are high in solid fats, added sugars, or sodium. Maintain a healthy weight Body mass index (BMI) is used to identify weight problems. It estimates body fat based on height and weight. Your health care provider can help determine your BMI and help you achieve or maintain a healthy weight. Get regular exercise Get regular exercise. This is one of the most important things you can do for your health. Most adults should:  Exercise for at least 150 minutes each week. The exercise should increase your heart rate and make you sweat (moderate-intensity exercise).  Do strengthening exercises at least twice a week. This is in addition to the moderate-intensity exercise.  Spend less time sitting. Even light physical activity can be beneficial. Watch cholesterol and blood lipids Have your blood tested for lipids and cholesterol at 56 years of age, then have this test every 5 years. Have your cholesterol levels checked more often if:  Your lipid or cholesterol levels are high.  You are older than 56 years of age.  You are at high risk for heart disease. What should I know about cancer screening? Depending on your health history and family history, you may need to have cancer screening at various ages. This may include screening for:  Breast cancer.  Cervical cancer.  Colorectal cancer.  Skin cancer.  Lung cancer. What should I know about heart disease, diabetes, and high blood  pressure? Blood pressure and heart disease  High blood pressure causes heart disease and increases the risk of stroke. This is more likely to develop in people who have high blood pressure readings, are of African descent, or are overweight.  Have your blood pressure checked: ? Every 3-5 years if you are 18-39 years of age. ? Every year if you are 40 years old or older. Diabetes Have regular diabetes screenings. This checks your fasting blood sugar level. Have the screening done:  Once every three years after age 40 if you are at a normal weight and have a low risk for diabetes.  More often and at a younger age if you are overweight or have a high risk for diabetes. What should I know about preventing infection? Hepatitis B If you have a higher risk for hepatitis B, you should be screened for this virus. Talk with your health care provider to find out if you are at risk for hepatitis B infection. Hepatitis C Testing is recommended for:  Everyone born from 1945 through 1965.  Anyone with known risk factors for hepatitis C. Sexually transmitted infections (STIs)  Get screened for STIs, including gonorrhea and chlamydia, if: ? You are sexually active and are younger than 56 years of age. ? You are older than 56 years of age and your health care provider tells you that you are at risk for this type of infection. ? Your sexual activity has changed since you were last screened, and you are at increased risk for chlamydia or gonorrhea. Ask your health care provider if   you are at risk.  Ask your health care provider about whether you are at high risk for HIV. Your health care provider may recommend a prescription medicine to help prevent HIV infection. If you choose to take medicine to prevent HIV, you should first get tested for HIV. You should then be tested every 3 months for as long as you are taking the medicine. Pregnancy  If you are about to stop having your period (premenopausal) and  you may become pregnant, seek counseling before you get pregnant.  Take 400 to 800 micrograms (mcg) of folic acid every day if you become pregnant.  Ask for birth control (contraception) if you want to prevent pregnancy. Osteoporosis and menopause Osteoporosis is a disease in which the bones lose minerals and strength with aging. This can result in bone fractures. If you are 65 years old or older, or if you are at risk for osteoporosis and fractures, ask your health care provider if you should:  Be screened for bone loss.  Take a calcium or vitamin D supplement to lower your risk of fractures.  Be given hormone replacement therapy (HRT) to treat symptoms of menopause. Follow these instructions at home: Lifestyle  Do not use any products that contain nicotine or tobacco, such as cigarettes, e-cigarettes, and chewing tobacco. If you need help quitting, ask your health care provider.  Do not use street drugs.  Do not share needles.  Ask your health care provider for help if you need support or information about quitting drugs. Alcohol use  Do not drink alcohol if: ? Your health care provider tells you not to drink. ? You are pregnant, may be pregnant, or are planning to become pregnant.  If you drink alcohol: ? Limit how much you use to 0-1 drink a day. ? Limit intake if you are breastfeeding.  Be aware of how much alcohol is in your drink. In the [place] one drink equals one 12 oz bottle of beer (355 mL), one 5 oz glass of wine (148 mL), or one 1 oz glass of hard liquor (44 mL). General instructions  Schedule regular health, dental, and eye exams.  Stay current with your vaccines.  Tell your health care provider if: ? You often feel depressed. ? You have ever been abused or do not feel safe at home. Summary  Adopting a healthy lifestyle and getting preventive care are important in promoting health and wellness.  Follow your health care provider's instructions about healthy  diet, exercising, and getting tested or screened for diseases.  Follow your health care provider's instructions on monitoring your cholesterol and blood pressure. This information is not intended to replace advice given to you by your health care provider. Make sure you discuss any questions you have with your health care provider. Document Released: 09/19/2010 Document Revised: 02/27/2018 Document Reviewed: 02/27/2018 Elsevier Patient Education  2020 [employer]  

## 2019-03-11 NOTE — Progress Notes (Signed)
Patient: Lisa Simmons, Female    DOB: 11/18/1960, 58 y.o.   MRN: [id number] Visit Date: 03/11/2019  Today's Provider: Trinna Post, PA-C   Chief Complaint  Patient presents with  . Annual Exam   Subjective:    Annual physical exam Lisa Simmons is a 58 y.o. female who presents today for health maintenance and complete physical. She feels well. She reports exercising none. She reports she is sleeping fairly well.  Takes 88 mcg synthroid for hypothyroidism which is followed by Dr. Selena Batten.   Zoloft 50 mg daily for anxiety, doing well with this. Declines HIV and Hep C screening.   She has a history of low vitamin D and is not currently taking vitamin D supplements.  ----------------------------------------------------------  Last Cologuard:05/09/2018, negative Last Pap:03/15/2017 Normal, negative HPV Last mammogram:03/15/2018, normal  Review of Systems  Gastrointestinal: Positive for diarrhea.  Musculoskeletal: Positive for arthralgias.  Psychiatric/Behavioral: Positive for decreased concentration. The patient is nervous/anxious.   All other systems reviewed and are negative.   Social History She  reports that she quit smoking about 32 years ago. Her smoking use included cigarettes. She has a 7.00 pack-year smoking history. She has never used smokeless tobacco. She reports current alcohol use. She reports that she does not use drugs. Social History   Socioeconomic History  . Marital status: Married    Spouse name: Not on file  . Number of children: 3  . Years of education: Not on file  . Highest education level: Not on file  Occupational History  . Occupation: Pharmacist, hospital  Tobacco Use  . Smoking status: Former Smoker    Packs/day: 1.00    Years: 7.00    Pack years: 7.00    Types: Cigarettes    Quit date: 03/20/1986    Years since quitting: 32.9  . Smokeless tobacco: Never Used  Substance and Sexual Activity  . Alcohol use: Yes    Alcohol/week: 0.0  standard drinks    Comment: 1-2 drinks per year  . Drug use: No  . Sexual activity: Not on file  Other Topics Concern  . Not on file  Social History Narrative  . Not on file   Social Determinants of Health   Financial Resource Strain:   . Difficulty of Paying Living Expenses: Not on file  Food Insecurity:   . Worried About Charity fundraiser in the Last Year: Not on file  . Ran Out of Food in the Last Year: Not on file  Transportation Needs:   . Lack of Transportation (Medical): Not on file  . Lack of Transportation (Non-Medical): Not on file  Physical Activity:   . Days of Exercise per Week: Not on file  . Minutes of Exercise per Session: Not on file  Stress:   . Feeling of Stress : Not on file  Social Connections:   . Frequency of Communication with Friends and Family: Not on file  . Frequency of Social Gatherings with Friends and Family: Not on file  . Attends Religious Services: Not on file  . Active Member of Clubs or Organizations: Not on file  . Attends Archivist Meetings: Not on file  . Marital Status: Not on file    Patient Active Problem List   Diagnosis Date Noted  . Diverticulitis 05/18/2017  . Irritable bowel syndrome 05/07/2015  . History of allergy 03/11/2015  . Anxiety 03/11/2015  . Arthritis 03/11/2015  . Chronic headache 03/11/2015  . Hypothyroidism, postop  03/11/2015  . Insomnia 03/11/2015  . Menopausal symptom 03/11/2015  . Osteopenia 03/11/2015  . Vitamin D deficiency 03/17/2011    Past Surgical History:  Procedure Laterality Date  . CESAREAN SECTION  1989  . [place]   x2  . SUPRACERVICAL ABDOMINAL HYSTERECTOMY  2003   due to Endometriosis and severe Anemia, Laproscopic; still has ovaries and cervix  . THYROIDECTOMY  06/2012   Dr. Jerral Bonito; due to multinodular goiter  . TONSILLECTOMY  1970    Family History  Family Status  Relation Name Status  . Mother  Alive  . Father  Deceased        Cause of Death: overdose; substance abuse  . Sister half sister Alive       substance abuse  . MGF  Deceased  . PGF  Deceased  . Sister  Alive  . Sister  Alive  . PGM  Deceased  . MGM  (Not Specified)  . Neg Hx  (Not Specified)   Her family history includes Alcohol abuse in her sister; Depression in her father; Diabetes in her mother; Heart disease in her father, maternal grandfather, maternal grandmother, paternal grandfather, and paternal grandmother; Hypothyroidism in her mother; Stroke in her maternal grandfather.     Allergies  Allergen Reactions  . Avelox  [Moxifloxacin Hcl In Nacl]     Other reaction(s): Rapid pulse  . Iodine     Other reaction(s): Hives  . Penicillin G Rash    Previous Medications   ALPRAZOLAM (XANAX) 0.25 MG TABLET    Take 1 tablet (0.25 mg total) by mouth at bedtime as needed for anxiety.   LEVOTHYROXINE (SYNTHROID) 88 MCG TABLET    Take 88 mcg by mouth daily.   OMEPRAZOLE (PRILOSEC) 40 MG CAPSULE    TAKE 1 CAPSULE BY MOUTH EVERY DAY   SERTRALINE (ZOLOFT) 50 MG TABLET    TAKE 1 TABLET BY MOUTH EVERY DAY    Patient Care Team: Birdie Sons, MD as PCP - General (Family Medicine) Margaretha Sheffield, MD (Otolaryngology)      Objective:   Vitals: BP 121/81 (BP Location: Right Arm, Patient Position: Sitting, Cuff Size: Large)   Pulse 80   Temp (!) 96.9 F (36.1 C) (Other (Comment))   Resp 16   Ht 5\' 3"  (1.6 m)   Wt 173 lb (78.5 kg)   LMP 03/16/2015   SpO2 96%   BMI 30.65 kg/m    Physical Exam Constitutional:      Appearance: Normal appearance.  Cardiovascular:     Rate and Rhythm: Normal rate and regular rhythm.     Heart sounds: Normal heart sounds.  Pulmonary:     Effort: Pulmonary effort is normal.     Breath sounds: Normal breath sounds.  Chest:     Breasts:        Right: Normal.        Left: Normal.  Abdominal:     General: Bowel sounds are normal.     Palpations: Abdomen is soft.  Skin:    General: Skin is warm and dry.    Neurological:     Mental Status: She is alert and oriented to person, place, and time. Mental status is at baseline.  Psychiatric:        Mood and Affect: Mood normal.        Behavior: Behavior normal.      Depression Screen PHQ 2/9 Scores 03/11/2019 03/06/2018 03/15/2017 03/06/2016  PHQ - 2 Score  0 0 0 0  PHQ- 9 Score 5 8 5 6       Assessment & Plan:     Routine Health Maintenance and Physical Exam  Exercise Activities and Dietary recommendations Goals   None     Immunization History  Administered Date(s) Administered  . Influenza,inj,Quad PF,6+ Mos 03/11/2019  . Influenza-Unspecified 02/02/2015, 01/01/2017, 01/31/2018  . Tdap 03/10/2013    Health Maintenance  Topic Date Due  . Hepatitis C Screening  03/10/2020 (Originally [birth year])  . HIV Screening  03/10/2020 (Originally 06/18/1975)  . MAMMOGRAM  03/05/2020  . Fecal DNA (Cologuard)  05/08/2021  . PAP SMEAR-Modifier  03/15/2022  . TETANUS/TDAP  03/11/2023  . INFLUENZA VACCINE  Completed     Discussed health benefits of physical activity, and encouraged her to engage in regular exercise appropriate for her age and condition.    1. Annual physical exam  - TSH - Lipid panel - Comprehensive metabolic panel - CBC with Differential/Platelet  2. Vitamin D deficiency  Recommend 1000 international unit daily.   - Vitamin D (25 hydroxy)  3. Encounter for screening mammogram for malignant neoplasm of breast  - MM Digital Screening; Future  4. Anxiety  Continue Zoloft 50 mg every day.   5. Need for influenza vaccination  - Flu Vaccine QUAD 36+ mos IM  The entirety of the information documented in the History of Present Illness, Review of Systems and Physical Exam were personally obtained by me. Portions of this information were initially documented by April M. Sabra Heck, CMA and reviewed by me for thoroughness and accuracy.   F/u 1 year CPE and follow up.   --------------------------------------------------------------------

## 2019-03-12 LAB — CBC WITH DIFFERENTIAL/PLATELET
Basophils Absolute: 0 10*3/uL (ref 0.0–0.2)
Basos: 1 %
EOS (ABSOLUTE): 0.2 10*3/uL (ref 0.0–0.4)
Eos: 2 %
Hematocrit: 38.6 % (ref 34.0–46.6)
Hemoglobin: 13.5 g/dL (ref 11.1–15.9)
Immature Grans (Abs): 0 10*3/uL (ref 0.0–0.1)
Immature Granulocytes: 0 %
Lymphocytes Absolute: 1.8 10*3/uL (ref 0.7–3.1)
Lymphs: 26 %
MCH: 33 pg (ref 26.6–33.0)
MCHC: 35 g/dL (ref 31.5–35.7)
MCV: 94 fL (ref 79–97)
Monocytes Absolute: 0.5 10*3/uL (ref 0.1–0.9)
Monocytes: 7 %
Neutrophils Absolute: 4.5 10*3/uL (ref 1.4–7.0)
Neutrophils: 64 %
Platelets: 253 10*3/uL (ref 150–450)
RBC: 4.09 x10E6/uL (ref 3.77–5.28)
RDW: 13 % (ref 11.7–15.4)
WBC: 7 10*3/uL (ref 3.4–10.8)

## 2019-03-12 LAB — COMPREHENSIVE METABOLIC PANEL
ALT: 16 IU/L (ref 0–32)
AST: 19 IU/L (ref 0–40)
Albumin/Globulin Ratio: 1.8 (ref 1.2–2.2)
Albumin: 4.3 g/dL (ref 3.8–4.9)
Alkaline Phosphatase: 100 IU/L (ref 39–117)
BUN/Creatinine Ratio: 10 (ref 9–23)
BUN: 9 mg/dL (ref 6–24)
Bilirubin Total: 0.3 mg/dL (ref 0.0–1.2)
CO2: 23 mmol/L (ref 20–29)
Calcium: 9.4 mg/dL (ref 8.7–10.2)
Chloride: 103 mmol/L (ref 96–106)
Creatinine, Ser: 0.87 mg/dL (ref 0.57–1.00)
GFR calc Af Amer: 86 mL/min/{1.73_m2} (ref >59)
GFR calc non Af Amer: 75 mL/min/{1.73_m2} (ref >59)
Globulin, Total: 2.4 g/dL (ref 1.5–4.5)
Glucose: 95 mg/dL (ref 65–99)
Potassium: 3.8 mmol/L (ref 3.5–5.2)
Sodium: 143 mmol/L (ref 134–144)
Total Protein: 6.7 g/dL (ref 6.0–8.5)

## 2019-03-12 LAB — LIPID PANEL
Chol/HDL Ratio: 4 ratio (ref 0.0–4.4)
Cholesterol, Total: 233 mg/dL — ABNORMAL HIGH (ref 100–199)
HDL: 58 mg/dL (ref >39)
LDL Chol Calc (NIH): 156 mg/dL — ABNORMAL HIGH (ref 0–99)
Triglycerides: 109 mg/dL (ref 0–149)
VLDL Cholesterol Cal: 19 mg/dL (ref 5–40)

## 2019-03-12 LAB — TSH: TSH: 1.47 u[IU]/mL (ref 0.450–4.500)

## 2019-03-12 LAB — VITAMIN D 25 HYDROXY (VIT D DEFICIENCY, FRACTURES): Vit D, 25-Hydroxy: 16.4 ng/mL — ABNORMAL LOW (ref 30.0–100.0)

## 2019-03-21 ENCOUNTER — Encounter: Payer: Self-pay | Admitting: Family Medicine

## 2019-03-24 ENCOUNTER — Other Ambulatory Visit: Payer: Self-pay

## 2019-03-24 DIAGNOSIS — G4700 Insomnia, unspecified: Secondary | ICD-10-CM

## 2019-03-24 MED ORDER — ALPRAZOLAM 0.25 MG PO TABS: 0.2500 mg | ORAL_TABLET | Freq: Every evening | ORAL | 4 refills | Status: DC | PRN

## 2019-03-28 ENCOUNTER — Ambulatory Visit
Admission: RE | Admit: 2019-03-28 | Discharge: 2019-03-28 | Disposition: A | Discharge status: Home / Self Care | Payer: BC Managed Care – PPO | Source: Ambulatory Visit | Attending: Physician Assistant | Admitting: Physician Assistant

## 2019-03-28 DIAGNOSIS — Z1231 Encounter for screening mammogram for malignant neoplasm of breast: Secondary | ICD-10-CM | POA: Diagnosis not present

## 2019-04-07 ENCOUNTER — Other Ambulatory Visit: Payer: Self-pay | Admitting: Family Medicine

## 2019-04-10 ENCOUNTER — Other Ambulatory Visit: Payer: Self-pay | Admitting: Family Medicine

## 2019-06-18 ENCOUNTER — Other Ambulatory Visit: Payer: Self-pay | Admitting: Family Medicine

## 2019-06-18 NOTE — Telephone Encounter (Signed)
Requested Prescriptions  Pending Prescriptions Disp Refills  . sertraline (ZOLOFT) 50 MG tablet [Pharmacy Med Name: SERTRALINE HCL 50 MG TABLET] 90 tablet 0    Sig: TAKE 1 TABLET BY MOUTH EVERY DAY     Psychiatry:  Antidepressants - SSRI Passed - 06/18/2019  1:31 AM      Passed - Valid encounter within last 6 months    Recent Outpatient Visits          3 months ago Annual physical exam   [facility] Carles Collet M, PA-C   4 months ago Acute bilateral low back pain without sciatica   [facility] Flinchum, Kelby Aline, FNP   4 months ago Acute right-sided low back pain without sciatica   [facility], Dionne Bucy, MD   1 year ago BMI 30.0-30.9,adult   [facility] Birdie Sons, MD   1 year ago Vaginal discharge   [place], [place], [place]

## 2019-08-11 ENCOUNTER — Telehealth: Payer: Self-pay

## 2019-08-11 NOTE — Telephone Encounter (Signed)
Yes, a Covid test would be a good idea.

## 2019-08-11 NOTE — Telephone Encounter (Signed)
Copied from [place] [id number]. Topic: General - Other >> Aug 11, 2019  4:11 PM Celene Kras wrote: Reason for CRM: Pt called stating that she has had diarrhea, body aches, nausea, and muscle fatigue for four days. Pt would like to know if PCP recommends a covid test since she has IBSD. Please advise.

## 2019-08-12 NOTE — Telephone Encounter (Signed)
Patient advised. She states she went this morning for a rapid COVID test results were negative.

## 2019-09-14 ENCOUNTER — Other Ambulatory Visit: Payer: Self-pay | Admitting: Family Medicine

## 2019-09-14 NOTE — Telephone Encounter (Signed)
30 day courtesy RF Requested Prescriptions  Pending Prescriptions Disp Refills   sertraline (ZOLOFT) 50 MG tablet [Pharmacy Med Name: SERTRALINE HCL 50 MG TABLET] 30 tablet 0    Sig: TAKE 1 TABLET BY MOUTH EVERY DAY     Psychiatry:  Antidepressants - SSRI Failed - 09/14/2019  9:34 AM      Failed - Valid encounter within last 6 months    Recent Outpatient Visits          6 months ago Annual physical exam   [facility] Carles Collet M, PA-C   6 months ago Acute bilateral low back pain without sciatica   [facility] Flinchum, Kelby Aline, FNP   7 months ago Acute right-sided low back pain without sciatica   [facility], Dionne Bucy, MD   1 year ago BMI 30.0-30.9,adult   [facility] Birdie Sons, MD   1 year ago Vaginal discharge   [place], [place], [place]

## 2019-10-09 ENCOUNTER — Telehealth: Payer: Self-pay | Admitting: Family Medicine

## 2019-10-09 ENCOUNTER — Other Ambulatory Visit: Payer: Self-pay | Admitting: Family Medicine

## 2019-10-09 NOTE — Telephone Encounter (Signed)
Pt called in for assistance. Pt says that she was told by pharmacy that a new Rx for sertraline (ZOLOFT) 50 MG tablet is needed. Pt says that she is requesting a refill on her medication.    Please assist.   Pharmacy:  [id number] - [place], [place] - [address] Phone:  [phone]  Fax:  [phone]

## 2019-10-10 ENCOUNTER — Other Ambulatory Visit: Payer: Self-pay | Admitting: Family Medicine

## 2019-10-10 NOTE — Telephone Encounter (Signed)
Requested  medications are  due for refill today yes  Requested medications are on the active medication list yes  Last refill 6/27  Last visit 02/2019  Future visit scheduled NO  Notes to clinic Has already been given a curtesy refill, still no visit scheduled.

## 2019-10-13 NOTE — Telephone Encounter (Signed)
See phone message 10/10/2019.  Thanks,   -Mickel Baas

## 2019-11-10 ENCOUNTER — Encounter: Payer: Self-pay | Admitting: Family Medicine

## 2019-11-10 ENCOUNTER — Telehealth (INDEPENDENT_AMBULATORY_CARE_PROVIDER_SITE_OTHER): Payer: BC Managed Care – PPO | Admitting: Family Medicine

## 2019-11-10 DIAGNOSIS — K580 Irritable bowel syndrome with diarrhea: Secondary | ICD-10-CM | POA: Diagnosis not present

## 2019-11-10 MED ORDER — DICYCLOMINE HCL 10 MG PO CAPS: 10.0000 mg | ORAL_CAPSULE | Freq: Three times a day (TID) | ORAL | 3 refills | Status: DC

## 2019-11-10 NOTE — Progress Notes (Addendum)
Virtual telephone visit    Virtual Visit via Telephone Note   This visit type was conducted due to national recommendations for restrictions regarding the COVID-19 Pandemic (e.g. social distancing) in an effort to limit this patient's exposure and mitigate transmission in our community. Due to her co-morbid illnesses, this patient is at least at moderate risk for complications without adequate follow up. This format is felt to be most appropriate for this patient at this time. The patient did not have access to video technology or had technical difficulties with video requiring transitioning to audio format only (telephone). Physical exam was limited to content and character of the telephone converstion.    Patient location: home Provider location: [facility]   Visit Date: 11/10/2019  Today's healthcare provider: Lelon Huh, MD   Chief Complaint  Patient presents with  . Irritable Bowel Syndrome   Subjective    HPI  Follow up for IBS:  Patient reports this is a chronic problem that she was diagnosed with may years ago. Patient states that her symptoms have worsened. 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 She states the school principal has warned her that she cannot keep stepping away from her class leaving her class unsupervised. Her symptoms include diarrhea, and abdominal cramps. Patient would like FMLA paperwork to cover her for any future absences she may encounter due symptom flare ups.Is more persistent related to stress. [place] no longer works, and has had accidents due to urgency. Is much better when she is at home, but still has 2-3 loose stools everyday. Also limits he ability to leave home on personal errands. She is going to try to finish out the month at work, but needs FMLA to cover from 1st through the end of December.  Is planning on retiring later this year.    -----------------------------------------------------------------------------------------       Medications: Outpatient Medications Prior to Visit  Medication Sig  . ALPRAZolam (XANAX) 0.25 MG tablet Take 1 tablet (0.25 mg total) by mouth at bedtime as needed for anxiety.  Marland Kitchen levothyroxine (SYNTHROID) 88 MCG tablet Take 88 mcg by mouth daily.  Marland Kitchen omeprazole (PRILOSEC) 40 MG capsule TAKE 1 CAPSULE BY MOUTH EVERY DAY  . sertraline (ZOLOFT) 50 MG tablet TAKE 1 TABLET BY MOUTH EVERY DAY   No facility-administered medications prior to visit.       Objective    LMP 03/16/2015   Awake, alert, oriented x 3. In no apparent distress    Assessment & Plan     1. Irritable bowel syndrome with diarrhea try- dicyclomine (BENTYL) 10 MG capsule; Take 1 capsule (10 mg total) by mouth 4 (four) times daily -  before meals and at bedtime.  Dispense: 90 capsule; Refill: 3   Is not able to continue her essential job duties in class due to urgent unpredictable need to have BM. Will need to FMLA to excuse from work starting September 1st.  No follow-ups on file.    I discussed the assessment and treatment plan with the patient. The patient was provided an opportunity to ask questions and all were answered. The patient agreed with the plan and demonstrated an understanding of the instructions.   The patient was advised to call back or seek an in-person evaluation if the symptoms worsen or if the condition fails to improve as anticipated.  I provided 12 minutes of non-face-to-face time during this encounter.  I  discussed the limitations of evaluation and management by telemedicine and the availability of in person appointments. The patient expressed understanding and agreed to proceed.   The entirety of the information documented in the History of Present Illness, Review of Systems and Physical Exam were personally obtained by me. Portions of this information were initially documented by the CMA  and reviewed by me for thoroughness and accuracy.     Lelon Huh, MD [facility] [phone] (phone) [phone] (fax)  [place]

## 2019-11-11 ENCOUNTER — Encounter: Payer: Self-pay | Admitting: Family Medicine

## 2019-11-12 NOTE — Telephone Encounter (Signed)
Pt advised completed form is at front desk for pt to pick up. TNP

## 2020-01-15 ENCOUNTER — Ambulatory Visit
Admission: RE | Admit: 2020-01-15 | Discharge: 2020-01-15 | Disposition: A | Discharge status: Home / Self Care | Payer: BC Managed Care – PPO | Attending: Adult Health | Admitting: Adult Health

## 2020-01-15 ENCOUNTER — Ambulatory Visit: Payer: BC Managed Care – PPO | Admitting: Adult Health

## 2020-01-15 ENCOUNTER — Other Ambulatory Visit: Payer: Self-pay

## 2020-01-15 ENCOUNTER — Encounter: Payer: Self-pay | Admitting: Adult Health

## 2020-01-15 ENCOUNTER — Ambulatory Visit
Admission: RE | Admit: 2020-01-15 | Discharge: 2020-01-15 | Disposition: A | Discharge status: Home / Self Care | Payer: BC Managed Care – PPO | Source: Ambulatory Visit | Attending: Adult Health | Admitting: Adult Health

## 2020-01-15 VITALS — BP 127/72 | HR 84 | Temp 98.4°F | Resp 16 | Ht 63.0 in | Wt 177.0 lb

## 2020-01-15 DIAGNOSIS — M79672 Pain in left foot: Secondary | ICD-10-CM | POA: Diagnosis present

## 2020-01-15 NOTE — Progress Notes (Signed)
Established patient visit   Patient: Lisa Simmons   DOB: [birth year]   59 y.o. Female  MRN: [id number] Visit Date: 01/15/2020  Today's healthcare provider: Marcille Buffy, FNP   Chief Complaint  Patient presents with  . Foot Swelling   Subjective    HPI  Patient presents today with pain and swelling in her left foot. She has had symptoms since yesterday. She denies any injuries. She has not taken anything OTC for symptoms. She describes it as a burning sensation.  Patient presents today with pain and swelling in her left foot. She has had symptoms since    Foot was hurting when she woke up yesterday morning.  She denies any bites or injuries. No previous injuries or surgeries to this foot in past.  She did do a lot of waling on Saturday.   Patient  denies any fever, body aches,chills, rash, chest pain, shortness of breath, nausea, vomiting, or diarrhea.      Medications: Outpatient Medications Prior to Visit  Medication Sig  . ALPRAZolam (XANAX) 0.25 MG tablet Take 1 tablet (0.25 mg total) by mouth at bedtime as needed for anxiety.  Marland Kitchen levothyroxine (SYNTHROID) 88 MCG tablet Take 88 mcg by mouth daily.  Marland Kitchen omeprazole (PRILOSEC) 40 MG capsule TAKE 1 CAPSULE BY MOUTH EVERY DAY  . sertraline (ZOLOFT) 50 MG tablet TAKE 1 TABLET BY MOUTH EVERY DAY  . [DISCONTINUED] dicyclomine (BENTYL) 10 MG capsule Take 1 capsule (10 mg total) by mouth 4 (four) times daily -  before meals and at bedtime.   No facility-administered medications prior to visit.    Review of Systems  Constitutional: Negative.   Cardiovascular: Negative for chest pain, palpitations and leg swelling.  Musculoskeletal: Positive for myalgias. Negative for arthralgias.  Skin: Negative.       Objective    BP 127/72 (BP Location: Right Arm)   Pulse 84   Temp 98.4 F (36.9 C)   Resp 16   Ht 5\' 3"  (1.6 m)   Wt 177 lb (80.3 kg)   LMP 03/16/2015   BMI 31.35 kg/m  BP Readings from Last 3  Encounters:  01/15/20 127/72  03/11/19 121/81  02/18/19 120/82   Wt Readings from Last 3 Encounters:  01/15/20 177 lb (80.3 kg)  03/11/19 173 lb (78.5 kg)  01/30/19 170 lb 12.8 oz (77.5 kg)      Physical Exam Constitutional:      General: She is not in acute distress.    Appearance: Normal appearance. She is not ill-appearing, toxic-appearing or diaphoretic.     Comments: Patient is alert and oriented and responsive to questions Engages in eye contact with provider. Speaks in full sentences without any pauses without any shortness of breath or distress.    HENT:     Head: Normocephalic and atraumatic.     Right Ear: External ear normal.     Left Ear: External ear normal.     Nose: Nose normal.     Mouth/Throat:     Pharynx: Oropharynx is clear.  Eyes:     General: No scleral icterus.    Conjunctiva/sclera: Conjunctivae normal.  Cardiovascular:     Rate and Rhythm: Normal rate and regular rhythm.     Pulses: Normal pulses.     Heart sounds: Normal heart sounds.  Pulmonary:     Effort: Pulmonary effort is normal.     Breath sounds: Normal breath sounds.  Abdominal:     Palpations: Abdomen is soft.  Musculoskeletal:        General: Normal range of motion.     Right ankle: Normal.     Right Achilles Tendon: Normal.     Left ankle: Normal.     Left Achilles Tendon: Normal.     Right foot: Normal.     Left foot: Normal range of motion and normal capillary refill. Swelling (mild lower dorsal foot with tenderness over 3rd metatarsal ) and bony tenderness present. No deformity, bunion, Charcot foot, foot drop, prominent metatarsal heads, laceration, tenderness or crepitus. Normal pulse.  Skin:    General: Skin is warm.     Capillary Refill: Capillary refill takes less than 2 seconds.     Findings: Erythema present.  Neurological:     General: No focal deficit present.  Psychiatric:        Mood and Affect: Mood normal.        Behavior: Behavior normal.        Thought  Content: Thought content normal.        Judgment: Judgment normal.      No results found for any visits on 01/15/20.  Assessment & Plan     Left foot pain - Plan: DG Foot Complete Left, CANCELED: DG Foot Complete Left  Pain at 3rd metatarsal and phalanx, will send for x ray. No injury known she does have tenderness with palpation and pain with walking.   Rest and elevate the affected painful area.  Apply cold compresses intermittently as needed.  As pain recedes, begin normal activities slowly as tolerated.  Call if symptoms persist.  Other differential possibly early presentation of gout. Will go for x ray and if within normal limits then will send Prednisone dose pack to take.  Strict return if worsening discussed.  Return in about 1 week (around 01/22/2020), or if symptoms worsen or fail to improve, for at any time for any worsening symptoms, Go to Emergency room/ urgent care if worse.         Marcille Buffy, [place] [phone] (phone) [phone] (fax)  [place]

## 2020-01-15 NOTE — Patient Instructions (Signed)

## 2020-01-16 ENCOUNTER — Other Ambulatory Visit: Payer: Self-pay | Admitting: Adult Health

## 2020-01-16 DIAGNOSIS — M79672 Pain in left foot: Secondary | ICD-10-CM

## 2020-01-16 MED ORDER — PREDNISONE 10 MG (21) PO TBPK
ORAL_TABLET | ORAL | 0 refills | Status: DC
Start: 2020-01-16 — End: 2020-01-23

## 2020-03-09 ENCOUNTER — Encounter: Payer: BC Managed Care – PPO | Admitting: Family Medicine

## 2020-03-09 ENCOUNTER — Other Ambulatory Visit: Payer: Self-pay

## 2020-03-09 ENCOUNTER — Encounter: Payer: BC Managed Care – PPO | Admitting: Physician Assistant

## 2020-03-10 ENCOUNTER — Encounter: Payer: Self-pay | Admitting: Family Medicine

## 2020-03-10 ENCOUNTER — Encounter: Payer: BC Managed Care – PPO | Admitting: Family Medicine

## 2020-03-16 ENCOUNTER — Encounter: Payer: Self-pay | Admitting: Family Medicine

## 2020-03-16 NOTE — Telephone Encounter (Signed)
Letter printed, signed and sent to medical records to scan to MyChart

## 2020-03-26 ENCOUNTER — Ambulatory Visit
Admission: RE | Admit: 2020-03-26 | Discharge: 2020-03-26 | Disposition: A | Discharge status: Home / Self Care | Payer: BC Managed Care – PPO | Source: Ambulatory Visit | Attending: Family Medicine | Admitting: Family Medicine

## 2020-03-26 ENCOUNTER — Ambulatory Visit
Admission: RE | Admit: 2020-03-26 | Discharge: 2020-03-26 | Disposition: A | Discharge status: Home / Self Care | Payer: BC Managed Care – PPO | Attending: Family Medicine | Admitting: Family Medicine

## 2020-03-26 ENCOUNTER — Ambulatory Visit (INDEPENDENT_AMBULATORY_CARE_PROVIDER_SITE_OTHER): Payer: BC Managed Care – PPO | Admitting: Family Medicine

## 2020-03-26 ENCOUNTER — Encounter: Payer: Self-pay | Admitting: Family Medicine

## 2020-03-26 ENCOUNTER — Other Ambulatory Visit: Payer: Self-pay | Admitting: Family Medicine

## 2020-03-26 ENCOUNTER — Other Ambulatory Visit: Payer: Self-pay

## 2020-03-26 VITALS — BP 135/87 | HR 82 | Resp 16 | Ht 63.0 in | Wt 176.0 lb

## 2020-03-26 DIAGNOSIS — E890 Postprocedural hypothyroidism: Secondary | ICD-10-CM | POA: Diagnosis not present

## 2020-03-26 DIAGNOSIS — G2581 Restless legs syndrome: Secondary | ICD-10-CM | POA: Diagnosis not present

## 2020-03-26 DIAGNOSIS — R413 Other amnesia: Secondary | ICD-10-CM

## 2020-03-26 DIAGNOSIS — E559 Vitamin D deficiency, unspecified: Secondary | ICD-10-CM

## 2020-03-26 DIAGNOSIS — M791 Myalgia, unspecified site: Secondary | ICD-10-CM

## 2020-03-26 DIAGNOSIS — N644 Mastodynia: Secondary | ICD-10-CM

## 2020-03-26 DIAGNOSIS — K589 Irritable bowel syndrome without diarrhea: Secondary | ICD-10-CM | POA: Diagnosis not present

## 2020-03-26 DIAGNOSIS — R0781 Pleurodynia: Secondary | ICD-10-CM

## 2020-03-26 DIAGNOSIS — M792 Neuralgia and neuritis, unspecified: Secondary | ICD-10-CM

## 2020-03-26 DIAGNOSIS — G4700 Insomnia, unspecified: Secondary | ICD-10-CM

## 2020-03-26 NOTE — Progress Notes (Signed)
Established patient visit   Patient: Lisa Simmons   DOB: [birth year]   60 y.o. Female  MRN: [id number] Visit Date: 03/26/2020  Today's healthcare provider: Lelon Huh, MD   Chief Complaint  Patient presents with  . ADHD   Subjective    HPI  Patient presents today c/o symptoms of ADD. She reports that she is having trouble concentrating on things and completing tasks. She has never been officially diagnosed. She is requesting evaluation. She really didn't notice it until she went to a NASCAR race last summer and felt completely overwhelmed and unable to keep up with events of the race. Since then she has noticed difficulty following television shows and conversations, and unable to complete tasks without getting distracted. 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 She has not had any trouble with attention or memory for most of career but feels like she started to struggle to stay organized over the last few years.   She also reports she started having a burning pain and tenderness on the side of her left breast a week or so ago. No known injury or trauma. No lumps, no redness. She is overdue for mammogram.   She also has issues with tingling and aching in legs which feel restless, especially at night which make it difficulty to sleep.    She also continues to struggle with IBS. Is unable to work due to sudden and sometimes uncontrollable need to have a bowel movement making it impossible to remain in classes to complete lessons. She has been out on disability since September and is still unable to return to work.      Medications: Outpatient Medications Prior to Visit  Medication Sig  . ALPRAZolam (XANAX) 0.25 MG tablet Take 1 tablet (0.25 mg total) by mouth at bedtime as needed for anxiety.  Marland Kitchen levothyroxine (SYNTHROID) 88 MCG tablet Take 88 mcg by mouth daily.  Marland Kitchen omeprazole  (PRILOSEC) 40 MG capsule TAKE 1 CAPSULE BY MOUTH EVERY DAY  . sertraline (ZOLOFT) 50 MG tablet TAKE 1 TABLET BY MOUTH EVERY DAY  . predniSONE (STERAPRED UNI-PAK 21 TAB) 10 MG (21) TBPK tablet PO: Take 6 tablets on day 1:Take 5 tablets day 2:Take 4 tablets day 3: Take 3 tablets day 4:Take 2 tablets day five: 5 Take 1 tablet day 6   No facility-administered medications prior to visit.    Review of Systems  Constitutional: Negative.   Respiratory: Negative for cough and shortness of breath.   Cardiovascular: Negative for leg swelling.  Musculoskeletal: Negative.   Neurological: Negative for dizziness and headaches.  Psychiatric/Behavioral: Positive for agitation, confusion and decreased concentration.      Objective    BP 135/87   Pulse 82   Resp 16   Ht 5\' 3"  (1.6 m)   Wt 176 lb (79.8 kg)   LMP 07/14/2001   BMI 31.18 kg/m    Physical Exam    General: Appearance:    Obese female in no acute distress  Eyes:    PERRL, conjunctiva/corneas clear, EOM's intact       Breast:     No tenderness or masses of left breast. Tender to palpation over chest wall adjust and under lateral left breast. No masses.   Heart:    Normal heart rate. Normal rhythm. No murmurs, rubs, or gallops.  MS:   All extremities are intact.   Neurologic:   Awake, alert, oriented x 3. No apparent focal neurological           defect.        Adult ADHD Self Report Scale (most recent)    Adult ADHD Self-Report Scale (ASRS-v1.1) Symptom Checklist - 03/26/20 0944      Part A   1. How often do you have trouble wrapping up the final details of a project, once the challenging parts have been done? Sometimes  2. How often do you have difficulty getting things done in order when you have to do a task that requires organization? Often    3. How often do you have problems remembering appointments or obligations? Often  4. When you have a task that requires a lot of thought, how often do you avoid or delay getting started?  Very Often    5. How often do you fidget or squirm with your hands or feet when you have to sit down for a long time? Very Often  6. How often do you feel overly active and compelled to do things, like you were driven by a motor? Sometimes      Part B   7. How often do you make careless mistakes when you have to work on a boring or difficult project? Sometimes  8. How often do you have difficulty keeping your attention when you are doing boring or repetitive work? Often    9. How often do you have difficulty concentrating on what people say to you, even when they are speaking to you directly? Often  10. How often do you misplace or have difficulty finding things at home or at work? Sometimes    11. How often are you distracted by activity or noise around you? Very Often  58. How often do you leave your seat in meetings or other situations in which you are expected to remain seated? Often    13. How often do you feel restless or fidgety? Very Often  59. How often do you have difficulty unwinding and relaxing when you have time to yourself? Often    15. How often do you find yourself talking too much when you are in social situations? Sometimes  16. When you are in a conversation, how often do you find yourself finishing the sentences of the people you are talking to, before they can finish them themselves? Sometimes    17. How often do you have difficulty waiting your turn in situations when turn taking is required? Sometimes  18. How often do you interrupt others when they are busy? Never      Comment   How old were you when these problems first began to occur? 42             Assessment & Plan     1. Memory problem Counseled that ADHD usually has onset in teens or early adulthood and that onset at her age 53 me indications of medical, metabolic, or psychological disorder. She does not have any other symptoms of depression or anxiety, but she has been out of work for an extended period due to  [employer] and IBS. Will check labs before considering any medication treatment.   - CBC - Comprehensive metabolic panel - Vitamin T55 - RPR  2. Restless leg  - Ferritin  3. Hypothyroidism, postop  - TSH - T4, free  4. 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 Letter written and disability forms completed to cover until the end of this month. She will be eligible to retire in February.   5. Insomnia, unspecified type   6. Vitamin D deficiency  - VITAMIN D 25 Hydroxy (Vit-D Deficiency, Fractures)  7. Myalgia She has not had Covid vaccine but did have Covid symptoms at one point.  - SAR CoV2 Serology (COVID 19)AB(IGG)IA  She should be vaccinated irregardless of covid antibody results.    8. Right rib pain - DG Ribs Unilateral Left; Future   She is overdue for mammogram and advised to schedule ASAP  Addressed extensive list of chronic and acute medical problems today requiring 45 minutes reviewing her medical record, counseling patient regarding her conditions and coordination of care.         The entirety of the information documented in the History of Present Illness, Review of Systems and Physical Exam were personally obtained by me. Portions of this information were initially documented by the CMA and reviewed by me for thoroughness and accuracy.      Lelon Huh, MD  [facility] [phone] (phone) [phone] (fax)  [place]

## 2020-03-26 NOTE — Patient Instructions (Signed)
.   Please review the attached list of medications and notify my office if there are any errors.   . Please bring all of your medications to every appointment so we can make sure that our medication list is the same as yours.   

## 2020-03-26 NOTE — Telephone Encounter (Signed)
-----   Message from Birdie Sons, MD sent at 03/26/2020 11:46 AM EST ----- Xray is negative, no fracture. She probably just pulled a muscle. Should heal in 1-2 weeks. Call otherwise

## 2020-03-26 NOTE — Telephone Encounter (Signed)
Pt called and verbalized understanding of message below.

## 2020-03-27 ENCOUNTER — Encounter: Payer: Self-pay | Admitting: Family Medicine

## 2020-03-27 LAB — COMPREHENSIVE METABOLIC PANEL
ALT: 23 IU/L (ref 0–32)
AST: 24 IU/L (ref 0–40)
Albumin/Globulin Ratio: 2 (ref 1.2–2.2)
Albumin: 4.6 g/dL (ref 3.8–4.9)
Alkaline Phosphatase: 84 IU/L (ref 44–121)
BUN/Creatinine Ratio: 9 (ref 9–23)
BUN: 7 mg/dL (ref 6–24)
Bilirubin Total: 0.4 mg/dL (ref 0.0–1.2)
CO2: 24 mmol/L (ref 20–29)
Calcium: 9.2 mg/dL (ref 8.7–10.2)
Chloride: 101 mmol/L (ref 96–106)
Creatinine, Ser: 0.82 mg/dL (ref 0.57–1.00)
GFR calc Af Amer: 92 mL/min/{1.73_m2} (ref >59)
GFR calc non Af Amer: 80 mL/min/{1.73_m2} (ref >59)
Globulin, Total: 2.3 g/dL (ref 1.5–4.5)
Glucose: 106 mg/dL — ABNORMAL HIGH (ref 65–99)
Potassium: 3.8 mmol/L (ref 3.5–5.2)
Sodium: 141 mmol/L (ref 134–144)
Total Protein: 6.9 g/dL (ref 6.0–8.5)

## 2020-03-27 LAB — CBC
Hematocrit: 38 % (ref 34.0–46.6)
Hemoglobin: 13.1 g/dL (ref 11.1–15.9)
MCH: 32.8 pg (ref 26.6–33.0)
MCHC: 34.5 g/dL (ref 31.5–35.7)
MCV: 95 fL (ref 79–97)
Platelets: 217 10*3/uL (ref 150–450)
RBC: 3.99 x10E6/uL (ref 3.77–5.28)
RDW: 13 % (ref 11.7–15.4)
WBC: 6 10*3/uL (ref 3.4–10.8)

## 2020-03-27 LAB — RPR: RPR Ser Ql: NONREACTIVE

## 2020-03-27 LAB — SAR COV2 SEROLOGY (COVID19)AB(IGG),IA
SARS-CoV-2 Semi-Quant IgG Ab: <13 AU/mL (ref <13.0)
SARS-CoV-2 Spike Ab Interp: NEGATIVE

## 2020-03-27 LAB — VITAMIN D 25 HYDROXY (VIT D DEFICIENCY, FRACTURES): Vit D, 25-Hydroxy: 22 ng/mL — ABNORMAL LOW (ref 30.0–100.0)

## 2020-03-27 LAB — T4, FREE: Free T4: 1.13 ng/dL (ref 0.82–1.77)

## 2020-03-27 LAB — TSH: TSH: 2.12 u[IU]/mL (ref 0.450–4.500)

## 2020-03-27 LAB — VITAMIN B12: Vitamin B-12: 257 pg/mL (ref 232–1245)

## 2020-03-27 LAB — FERRITIN: Ferritin: 110 ng/mL (ref 15–150)

## 2020-03-29 ENCOUNTER — Telehealth: Payer: Self-pay

## 2020-03-29 MED ORDER — DOXYCYCLINE HYCLATE 100 MG PO TABS: 100.0000 mg | ORAL_TABLET | Freq: Two times a day (BID) | ORAL | 0 refills | Status: DC

## 2020-03-29 NOTE — Telephone Encounter (Signed)
Have sent prescription doxycycline.

## 2020-03-29 NOTE — Telephone Encounter (Signed)
Patient advised of lab results. She says her Vitamin D levels are always low. She is currently taking Vitamin D3 2,000 units daily. Should she increase the does since her levels are still low?  Also, patient states she took a home COVID test on Saturday (03/28/2019) and the results were positive. She wants to know if she needs to get an infusion? She is currently having symptoms of productive cough, burning and tightness in chest when coughing, muscle pain, low grade fever of 99.5 and headache. Symptoms started Friday. Please advise.

## 2020-03-29 NOTE — Telephone Encounter (Signed)
She needs to double the dose of vitamin D that she is taking. Can place referral to infusion clinic, but advise her that there is a critical shortage so cannot guarantee that she will qualify.

## 2020-03-29 NOTE — Telephone Encounter (Signed)
Patient advised and verbalized understanding. Patient states she changed her mind about wanting to have the infusion done. Patient wants to know if you will call in a prescription such as an antibiotic to cover her from developing Bronchitis. I offered her a virtual visit to discuss symptoms. Patient declined appointment and states that she was just seen in the office Friday and doesn't want to pay for another visit. Patient says all of her family has the same symptoms, and their doctors called in a prescription without being seen. She says she doesn't understand why she needs a virtual appointment if she was just seen in the office. Please advise.

## 2020-03-29 NOTE — Telephone Encounter (Signed)
-----   Message from Birdie Sons, MD sent at 03/29/2020  7:06 AM EST ----- Please advise her vitamin D levels are low, which can affect memory and concentration. Otherwise her labs are normal. Recommend she start taking OTC vitamin D3 2000 units every day and recheck vitamin D levels in 3 months.

## 2020-04-02 ENCOUNTER — Encounter: Payer: Self-pay | Admitting: Family Medicine

## 2020-04-02 DIAGNOSIS — L989 Disorder of the skin and subcutaneous tissue, unspecified: Secondary | ICD-10-CM

## 2020-04-06 ENCOUNTER — Other Ambulatory Visit: Payer: Self-pay | Admitting: Family Medicine

## 2020-04-20 ENCOUNTER — Ambulatory Visit
Admission: RE | Admit: 2020-04-20 | Discharge: 2020-04-20 | Disposition: A | Discharge status: Home / Self Care | Payer: BC Managed Care – PPO | Source: Ambulatory Visit | Attending: Family Medicine | Admitting: Family Medicine

## 2020-04-20 ENCOUNTER — Other Ambulatory Visit: Payer: Self-pay

## 2020-04-20 ENCOUNTER — Other Ambulatory Visit: Payer: BC Managed Care – PPO

## 2020-04-20 DIAGNOSIS — N644 Mastodynia: Secondary | ICD-10-CM | POA: Insufficient documentation

## 2020-05-11 ENCOUNTER — Telehealth: Payer: Self-pay

## 2020-05-11 NOTE — Telephone Encounter (Signed)
Copied from [place] [phone]. Topic: General - Other >> May 11, 2020  2:03 PM Tessa Lerner A wrote: Albert from [place] has made contact to confirm that patient's records have been forwarded to [facility]  Please contact to advise

## 2020-06-01 NOTE — Telephone Encounter (Signed)
[facility] has processed the request. TNP

## 2020-06-16 ENCOUNTER — Other Ambulatory Visit: Payer: Self-pay

## 2020-06-16 ENCOUNTER — Telehealth: Payer: Self-pay

## 2020-06-16 DIAGNOSIS — E559 Vitamin D deficiency, unspecified: Secondary | ICD-10-CM

## 2020-06-16 NOTE — Telephone Encounter (Signed)
Copied from [place] [id number]. Topic: General - Other >> Jun 16, 2020 12:06 PM Keene Breath wrote: Reason for CRM: Patient called to ask the doctor to put in orders to have her Vitamin D checked.  She stated the doctor wanted it done 3 months after her visit in January.  Please advise and let patient know when the orders are in.  CB# [phone]

## 2020-06-17 ENCOUNTER — Encounter: Payer: Self-pay | Admitting: Family Medicine

## 2020-06-17 LAB — VITAMIN D 25 HYDROXY (VIT D DEFICIENCY, FRACTURES): Vit D, 25-Hydroxy: 46.3 ng/mL (ref 30.0–100.0)

## 2020-06-18 MED ORDER — DIPHENOXYLATE-ATROPINE 2.5-0.025 MG PO TABS: 1.0000 | ORAL_TABLET | Freq: Four times a day (QID) | ORAL | 1 refills | Status: DC | PRN

## 2020-06-21 ENCOUNTER — Encounter: Payer: Self-pay | Admitting: Family Medicine

## 2020-06-22 MED ORDER — ONDANSETRON 4 MG PO TBDP: 4.0000 mg | ORAL_TABLET | Freq: Three times a day (TID) | ORAL | 0 refills | Status: AC | PRN

## 2020-06-29 ENCOUNTER — Other Ambulatory Visit: Payer: Self-pay | Admitting: Family Medicine

## 2020-08-11 ENCOUNTER — Ambulatory Visit: Payer: BC Managed Care – PPO | Admitting: Dermatology

## 2020-08-11 ENCOUNTER — Other Ambulatory Visit: Payer: Self-pay

## 2020-08-11 DIAGNOSIS — D485 Neoplasm of uncertain behavior of skin: Secondary | ICD-10-CM | POA: Diagnosis not present

## 2020-08-11 DIAGNOSIS — L814 Other melanin hyperpigmentation: Secondary | ICD-10-CM

## 2020-08-11 DIAGNOSIS — L821 Other seborrheic keratosis: Secondary | ICD-10-CM

## 2020-08-11 DIAGNOSIS — Z1283 Encounter for screening for malignant neoplasm of skin: Secondary | ICD-10-CM

## 2020-08-11 DIAGNOSIS — Z808 Family history of malignant neoplasm of other organs or systems: Secondary | ICD-10-CM

## 2020-08-11 DIAGNOSIS — D1800 Hemangioma unspecified site: Secondary | ICD-10-CM

## 2020-08-11 DIAGNOSIS — L578 Other skin changes due to chronic exposure to nonionizing radiation: Secondary | ICD-10-CM

## 2020-08-11 DIAGNOSIS — D229 Melanocytic nevi, unspecified: Secondary | ICD-10-CM

## 2020-08-11 NOTE — Patient Instructions (Addendum)
Melanoma ABCDEs  Melanoma is the most dangerous type of skin cancer, and is the leading cause of death from skin disease.  You are more likely to develop melanoma if you:  Have light-colored skin, light-colored eyes, or red or blond hair  Spend a lot of time in the sun  Tan regularly, either outdoors or in a tanning bed  Have had blistering sunburns, especially during childhood  Have a close family member who has had a melanoma  Have atypical moles or large birthmarks  Early detection of melanoma is key since treatment is typically straightforward and cure rates are extremely high if we catch it early.   The first sign of melanoma is often a change in a mole or a new dark spot.  The ABCDE system is a way of remembering the signs of melanoma.  A for asymmetry:  The two halves do not match. B for border:  The edges of the growth are irregular. C for color:  A mixture of colors are present instead of an even brown color. D for diameter:  Melanomas are usually (but not always) greater than 66mm - the size of a pencil eraser. E for evolution:  The spot keeps changing in size, shape, and color.  Please check your skin once per month between visits. You can use a small mirror in front and a large mirror behind you to keep an eye on the back side or your body.   If you see any new or changing lesions before your next follow-up, please call to schedule a visit.  Recommend taking Heliocare sun protection supplement daily in sunny weather for additional sun protection. For maximum protection on the sunniest days, you can take up to 2 capsules of regular Heliocare OR take 1 capsule of Heliocare Ultra. For prolonged exposure (such as a full day in the sun), you can repeat your dose of the supplement 4 hours after your first dose. Heliocare can be purchased at [facility] or at [email or web address].   Recommend daily broad spectrum sunscreen SPF 30+ to sun-exposed areas, reapply every 2 hours  as needed. Call for new or changing lesions.  Staying in the shade or wearing long sleeves, sun glasses (UVA+UVB protection) and wide brim hats (4-inch brim around the entire circumference of the hat) are also recommended for sun protection.      If you have any questions or concerns for your doctor, please call our main line at [phone] and press option 4 to reach your doctor's medical assistant. If no one answers, please leave a voicemail as directed and we will return your call as soon as possible. Messages left after 4 pm will be answered the following business day.   You may also send [place] a message via [place]. We typically respond to MyChart messages within 1-2 business days.  For prescription refills, please ask your pharmacy to contact our office. Our fax number is [phone].  If you have an urgent issue when the clinic is closed that cannot wait until the next business day, you can page your doctor at the number below.    Please note that while we do our best to be available for urgent issues outside of office hours, we are not available 24/7.   If you have an urgent issue and are unable to reach [place], you may choose to seek medical care at your doctor's office, retail clinic, urgent care center, or emergency room.  If you have a medical emergency, please immediately  call 911 or go to the emergency department.  Pager Numbers  - Dr. Nehemiah Massed: [phone]  - Dr. Laurence Ferrari: [phone]  - Dr. Nicole Kindred: [phone]  In the event of inclement weather, please call our main line at [phone] for an update on the status of any delays or closures.  Dermatology Medication Tips: Please keep the boxes that topical medications come in in order to help keep track of the instructions about where and how to use these. Pharmacies typically print the medication instructions only on the boxes and not directly on the medication tubes.   If your medication is too expensive, please contact  our office at [phone] option 4 or send [place] a message through [place].   We are unable to tell what your co-pay for medications will be in advance as this is different depending on your insurance coverage. However, we may be able to find a substitute medication at lower cost or fill out paperwork to get insurance to cover a needed medication.   If a prior authorization is required to get your medication covered by your insurance company, please allow [place] 1-2 business days to complete this process.  Drug prices often vary depending on where the prescription is filled and some pharmacies may offer cheaper prices.  The website www.goodrx.com contains coupons for medications through different pharmacies. The prices here do not account for what the cost may be with help from insurance (it may be cheaper with your insurance), but the website can give you the price if you did not use any insurance.  - You can print the associated coupon and take it with your prescription to the pharmacy.  - You may also stop by our office during regular business hours and pick up a GoodRx coupon card.  - If you need your prescription sent electronically to a different pharmacy, notify our office through [facility] or by phone at [phone] option 4.

## 2020-08-11 NOTE — Progress Notes (Signed)
   New Patient Visit  Subjective  Lisa Simmons is a 60 y.o. female who presents for the following: FBSE (Patient here for full body skin exam and skin cancer screening. Patient with no personal hx of skin cancer, patient's mother with hx of melanoma. She does have a few spots she wants to have checked to be sure they are ok. ).   The following portions of the chart were reviewed this encounter and updated as appropriate:   Tobacco  Allergies  Meds  Problems  Med Hx  Surg Hx  Fam Hx      Review of Systems:  No other skin or systemic complaints except as noted in HPI or Assessment and Plan.  Objective  Well appearing patient in no apparent distress; mood and affect are within normal limits.  A full examination was performed including scalp, head, eyes, ears, nose, lips, neck, chest, axillae, abdomen, back, buttocks, bilateral upper extremities, bilateral lower extremities, hands, feet, fingers, toes, fingernails, and toenails. All findings within normal limits unless otherwise noted below.  Objective  Upper Mid Abdomen: 0.5cm tan and brown thin papule        Assessment & Plan  Neoplasm of uncertain behavior of skin Upper Mid Abdomen  Favor ISK with pigment incontinence  Recheck in 2 months.  By history was white and scaly and fell off.   Benign-appearing today. Call clinic for new or changing lesions.  Recommend daily use of broad spectrum spf 30+ sunscreen to sun-exposed areas.     Lentigines - Scattered tan macules - Due to sun exposure - Benign-appering, observe - Recommend daily broad spectrum sunscreen SPF 30+ to sun-exposed areas, reapply every 2 hours as needed. - Call for any changes  Seborrheic Keratoses - Stuck-on, waxy, tan-brown papules and/or plaques  - Benign-appearing - Discussed benign etiology and prognosis. - Observe - Call for any changes  Melanocytic Nevi - Tan-brown and/or pink-flesh-colored symmetric macules and papules - Benign  appearing on exam today - Observation - Call clinic for new or changing moles - Recommend daily use of broad spectrum spf 30+ sunscreen to sun-exposed areas.   Hemangiomas - Red papules - Discussed benign nature - Observe - Call for any changes  Actinic Damage - Chronic condition, secondary to cumulative UV/sun exposure - diffuse scaly erythematous macules with underlying dyspigmentation - Recommend daily broad spectrum sunscreen SPF 30+ to sun-exposed areas, reapply every 2 hours as needed.  - Staying in the shade or wearing long sleeves, sun glasses (UVA+UVB protection) and wide brim hats (4-inch brim around the entire circumference of the hat) are also recommended for sun protection.  - Call for new or changing lesions.  Skin cancer screening performed today.   Return in about 2 months (around 10/11/2020) for recheck spot at abdomen, 1 year FBSE.  Graciella Belton, RMA, am acting as scribe for Forest Gleason, MD .  Documentation: I have reviewed the above documentation for accuracy and completeness, and I agree with the above.  Forest Gleason, MD

## 2020-08-15 ENCOUNTER — Encounter: Payer: Self-pay | Admitting: Dermatology

## 2020-08-20 ENCOUNTER — Encounter: Payer: Self-pay | Admitting: Family Medicine

## 2020-09-03 ENCOUNTER — Telehealth: Payer: Self-pay | Admitting: Family Medicine

## 2020-09-03 ENCOUNTER — Other Ambulatory Visit: Payer: Self-pay | Admitting: Family Medicine

## 2020-09-03 DIAGNOSIS — G4700 Insomnia, unspecified: Secondary | ICD-10-CM

## 2020-09-03 NOTE — Telephone Encounter (Signed)
Pt is calling to ask if the letter that was written by Dr. Caryn Section for [employer] can be mailed to her.

## 2020-09-03 NOTE — Telephone Encounter (Signed)
Requested medication (s) are due for refill today: no . Expired medication- xanax  Requested medication (s) are on the active medication list: yes   Last refill:  zoloft - 04/06/20 #90 1 refill, prilosec- 06/29/20 #90 0 refills. Xanax expired.  Future visit scheduled: no   Notes to clinic:  too soon for refill for zoloft and prilosec. Due 10/04/20. Xanax expired1/4/21 #20 4 refills . Do you want to renew Rx?     Requested Prescriptions  Pending Prescriptions Disp Refills   sertraline (ZOLOFT) 50 MG tablet [Pharmacy Med Name: SERTRALINE HCL 50 MG TABLET] 90 tablet 1    Sig: TAKE 1 TABLET BY MOUTH EVERY DAY      Psychiatry:  Antidepressants - SSRI Passed - 09/03/2020  4:10 PM      Passed - Valid encounter within last 6 months    Recent Outpatient Visits           5 months ago Memory problem   [facility] Birdie Sons, MD   7 months ago Left foot pain   [place] Flinchum, Kelby Aline, FNP   9 months ago Irritable bowel syndrome with diarrhea   [facility] Birdie Sons, MD   1 year ago Annual physical exam   [facility] Carles Collet M, PA-C   1 year ago Acute bilateral low back pain without sciatica   [place], Kelby Aline, FNP       Future Appointments             In 1 month [facility], [place], MD [place]   In 45 months [place], [place], MD [place]               omeprazole ([place]) 40 MG capsule [Pharmacy Med Name: OMEPRAZOLE DR 40 MG CAPSULE] 90 capsule 0    Sig: TAKE [place]      Gastroenterology: Proton Pump Inhibitors Passed - 09/03/2020  4:10 PM      Passed - Valid encounter within last 12 months    Recent Outpatient Visits           5 months ago Memory problem   [facility] Birdie Sons, MD   7 months ago Left foot pain   [place] Flinchum, Kelby Aline, FNP   9 months ago Irritable  bowel syndrome with diarrhea   [facility] Birdie Sons, MD   1 year ago Annual physical exam   [facility] [place], Fabio Bering M, PA-C   1 year ago Acute bilateral low back pain without sciatica   [place], Kelby Aline, FNP       Future Appointments             In 1 month [place], [place], MD [place]   In 11 months [place], [place], MD [place]               ALPRAZolam Duanne Moron) 0.25 MG tablet [Pharmacy Med Name: ALPRAZOLAM 0.25 MG TABLET] 20 tablet     Sig: Take 1 tablet (0.25 mg total) by mouth at bedtime as needed for anxiety.      Not Delegated - Psychiatry:  Anxiolytics/Hypnotics Failed - 09/03/2020  4:10 PM      Failed - This refill cannot be delegated      Failed - Urine Drug Screen completed in last 360 days      Passed -  Valid encounter within last 6 months    Recent Outpatient Visits           5 months ago Memory problem   [facility] Birdie Sons, MD   7 months ago Left foot pain   [facility] Flinchum, Kelby Aline, FNP   9 months ago Irritable bowel syndrome with diarrhea   [facility] Birdie Sons, MD   1 year ago Annual physical exam   [facility] Carles Collet M, PA-C   1 year ago Acute bilateral low back pain without sciatica   [place], Kelby Aline, FNP       Future Appointments             In 1 month [facility], [place], [place]   In 11 months [facility], [place], [place]

## 2020-09-07 NOTE — Telephone Encounter (Signed)
Letter sealed in an envelope and placed up front to be mailed.

## 2020-09-14 LAB — VITAMIN D 25 HYDROXY (VIT D DEFICIENCY, FRACTURES): Vit D, 25-Hydroxy: 34 ng/mL (ref 30.0–100.0)

## 2020-10-13 ENCOUNTER — Ambulatory Visit: Payer: BC Managed Care – PPO | Admitting: Dermatology

## 2021-01-15 ENCOUNTER — Other Ambulatory Visit: Payer: Self-pay | Admitting: Family Medicine

## 2021-01-15 NOTE — Telephone Encounter (Signed)
Requested medication (s) are due for refill today: yes  Requested medication (s) are on the active medication list: yes  Last refill:  yes  Future visit scheduled: yes  Notes to clinic:  med not delegated to NT to RF   Requested Prescriptions  Pending Prescriptions Disp Refills   diphenoxylate-atropine (LOMOTIL) 2.5-0.025 MG tablet [Pharmacy Med Name: DIPHENOXYLATE-ATROP 2.5-0.025] 60 tablet     Sig: Take 1-2 tablets by mouth 4 (four) times daily as needed for diarrhea or loose stools.     Not Delegated - Gastroenterology:  Antidiarrheals Failed - 01/15/2021  5:36 PM      Failed - This refill cannot be delegated      Passed - Valid encounter within last 12 months    Recent Outpatient Visits           9 months ago Memory problem   [facility] Birdie Sons, MD   1 year ago Left foot pain   [place] Flinchum, Kelby Aline, FNP   1 year ago Irritable bowel syndrome with diarrhea   [facility] Birdie Sons, MD   1 year ago Annual physical exam   [facility] Carles Collet M, PA-C   1 year ago Acute bilateral low back pain without sciatica   [facility] Flinchum, Kelby Aline, FNP       Future Appointments             In 1 month [facility], [place], [place]   In 8 months [place], [place], [place]

## 2021-01-26 ENCOUNTER — Telehealth: Payer: Self-pay

## 2021-01-26 NOTE — Telephone Encounter (Signed)
Copied from [place] [id number]. Topic: General - Other >> Jan 26, 2021  2:37 PM Leward Quan A wrote: Reason for CRM: Patient called in stated that she was directed to [facility] for flu and RSV testing but when she got there was told these tests are only performed on patients under 18. Please advise Ph# [phone]

## 2021-02-17 ENCOUNTER — Ambulatory Visit: Payer: Self-pay | Admitting: *Deleted

## 2021-02-17 NOTE — Telephone Encounter (Signed)
Pt called in c/o her right ear feeling full.  She can't hear out of it and there is a loud buzzing sound present.   Also full feeling in front of her ear area.   No drainage.   She woke up like this this morning.   She has been sick with the flu and a cold recently.   Still has a runny nose and cough.  There are no appts available at [facility] so I have referred her to the urgent care which she is agreeable to going.

## 2021-02-17 NOTE — Telephone Encounter (Signed)
Reason for Disposition  Patient sounds very sick or weak to the triager    Can't hear and has a loud buzzing sound in right ear.  Answer Assessment - Initial Assessment Questions 1. LOCATION: "Which ear is involved?"     Right ear I can't hear anything at all.   That side of my head feels like it's blowing out.   Nov. 8th I had the flu and then I had a head cold.   2. ONSET: "When did the ear start hurting"      This morning when I woke up.   When I went to bed no pain or problems with my ear at all.  3. SEVERITY: "How bad is the pain?"  (Scale 1-10; mild, moderate or severe)   - MILD (1-3): doesn't interfere with normal activities    - MODERATE (4-7): interferes with normal activities or awakens from sleep    - SEVERE (8-10): excruciating pain, unable to do any normal activities      No pain just pressure and a loud buzzing sound.   Never happened before 4. URI SYMPTOMS: "Do you have a runny nose or cough?"     I have a cough from flu and a runny nose.   My other symptoms have resolved. 5. FEVER: "Do you have a fever?" If Yes, ask: "What is your temperature, how was it measured, and when did it start?"     No 6. CAUSE: "Have you been swimming recently?", "How often do you use Q-TIPS?", "Have you had any recent air travel or scuba diving?"     No  scuba diving or swimming. 7. OTHER SYMPTOMS: "Do you have any other symptoms?" (e.g., headache, stiff neck, dizziness, vomiting, runny nose, decreased hearing)     Runny nose and can't hear from my ear and it's buzzing.   No drainage from ear.   Around my ear and front of my ear feels full. 8. PREGNANCY: "Is there any chance you are pregnant?" "When was your last menstrual period?"     Not asked  Protocols used: Bethann Punches

## 2021-03-02 ENCOUNTER — Ambulatory Visit (INDEPENDENT_AMBULATORY_CARE_PROVIDER_SITE_OTHER): Payer: BC Managed Care – PPO | Admitting: Family Medicine

## 2021-03-02 ENCOUNTER — Other Ambulatory Visit: Payer: Self-pay

## 2021-03-02 ENCOUNTER — Encounter: Payer: Self-pay | Admitting: Family Medicine

## 2021-03-02 VITALS — BP 134/82 | HR 83 | Resp 15 | Ht 63.0 in | Wt 158.2 lb

## 2021-03-02 DIAGNOSIS — R634 Abnormal weight loss: Secondary | ICD-10-CM

## 2021-03-02 DIAGNOSIS — T753XXA Motion sickness, initial encounter: Secondary | ICD-10-CM

## 2021-03-02 DIAGNOSIS — E559 Vitamin D deficiency, unspecified: Secondary | ICD-10-CM | POA: Diagnosis not present

## 2021-03-02 DIAGNOSIS — N644 Mastodynia: Secondary | ICD-10-CM | POA: Diagnosis not present

## 2021-03-02 DIAGNOSIS — E890 Postprocedural hypothyroidism: Secondary | ICD-10-CM

## 2021-03-02 DIAGNOSIS — Z1231 Encounter for screening mammogram for malignant neoplasm of breast: Secondary | ICD-10-CM | POA: Diagnosis not present

## 2021-03-02 DIAGNOSIS — N6320 Unspecified lump in the left breast, unspecified quadrant: Secondary | ICD-10-CM | POA: Insufficient documentation

## 2021-03-02 DIAGNOSIS — G4700 Insomnia, unspecified: Secondary | ICD-10-CM | POA: Diagnosis not present

## 2021-03-02 DIAGNOSIS — K580 Irritable bowel syndrome with diarrhea: Secondary | ICD-10-CM

## 2021-03-02 DIAGNOSIS — Z0000 Encounter for general adult medical examination without abnormal findings: Secondary | ICD-10-CM

## 2021-03-02 DIAGNOSIS — Z1211 Encounter for screening for malignant neoplasm of colon: Secondary | ICD-10-CM

## 2021-03-02 DIAGNOSIS — F419 Anxiety disorder, unspecified: Secondary | ICD-10-CM | POA: Diagnosis not present

## 2021-03-02 DIAGNOSIS — N905 Atrophy of vulva: Secondary | ICD-10-CM

## 2021-03-02 MED ORDER — SCOPOLAMINE 1 MG/3DAYS TD PT72: 1.0000 | MEDICATED_PATCH | TRANSDERMAL | 0 refills | Status: DC

## 2021-03-02 MED ORDER — ESTROGENS CONJUGATED 0.625 MG PO TABS: ORAL_TABLET | ORAL | 0 refills | Status: DC

## 2021-03-02 NOTE — Assessment & Plan Note (Signed)
Repeat labs:

## 2021-03-02 NOTE — Assessment & Plan Note (Signed)
Down 23# Breast tenderness and cysts noted in L breast US and dx mammo ordered Recommend lab work

## 2021-03-02 NOTE — Assessment & Plan Note (Signed)
Unable to do exam d/t discomfort Trial of oral therapy

## 2021-03-02 NOTE — Assessment & Plan Note (Signed)
Request for flight upcoming

## 2021-03-02 NOTE — Assessment & Plan Note (Signed)
Chronic, stable 

## 2021-03-02 NOTE — Assessment & Plan Note (Signed)
IBS; due for screening

## 2021-03-02 NOTE — Progress Notes (Signed)
Complete physical exam   Patient: Lisa Simmons   DOB: 1960-04-16   60 y.o. Female  MRN: [id number] Visit Date: 03/02/2021  Today's healthcare provider: Gwyneth Sprout, FNP   Chief Complaint  Patient presents with   Annual Exam   Subjective     HPI  Lisa Simmons is a 60 y.o. female who presents today for a complete physical exam.  She reports consuming a general diet of 2 meals a day . The patient does not participate in regular exercise at present. She generally feels fairly well. She reports sleeping poorly. She does have additional problems to discuss today, patient reports that since August she has had unexplained weight loss and has loss a total of 23lbs. Patient reports that during intercourse she experiences severe pain, patient states that she has tried lubrication such as "astroglide" but states that it does not help. Patient states that she has abstained from sex in the past two months because of this issue. Patient reports that she has been having difficulty at night falling/staying asleep.   Past Medical History:  Diagnosis Date   History of chicken pox    History of measles    History of mumps    Past Surgical History:  Procedure Laterality Date   CESAREAN SECTION  1989   DILATION AND CURETTAGE OF UTERUS  1983   x2   SUPRACERVICAL ABDOMINAL HYSTERECTOMY  2003   due to Endometriosis and severe Anemia, Laproscopic; still has ovaries and cervix   THYROIDECTOMY  06/2012   Dr. Jerral Bonito; due to multinodular goiter   TONSILLECTOMY  1970   Social History   Socioeconomic History   Marital status: Married    Spouse name: Not on file   Number of children: 3   Years of education: Not on file   Highest education level: Not on file  Occupational History   Occupation: Teacher  Tobacco Use   Smoking status: Former    Packs/day: 1.00    Years: 7.00    Pack years: 7.00    Types: Cigarettes    Quit date: 03/20/1986    Years since quitting: 34.9   Smokeless  tobacco: Never  Substance and Sexual Activity   Alcohol use: Yes    Alcohol/week: 0.0 standard drinks    Comment: 1-2 drinks per year   Drug use: No   Sexual activity: Not on file  Other Topics Concern   Not on file  Social History Narrative   Not on file   Social Determinants of Health   Financial Resource Strain: Not on file  Food Insecurity: Not on file  Transportation Needs: Not on file  Physical Activity: Not on file  Stress: Not on file  Social Connections: Not on file  Intimate Partner Violence: Not on file   Family Status  Relation Name Status   Mother  Deceased   Father  Deceased       Cause of Death: overdose; substance abuse   Sister half sister Alive       substance abuse   MGF  Deceased   PGF  Deceased   Sister  Alive   Sister  Alive   PGM  Deceased   MGM  (Not Specified)   Neg Hx  (Not Specified)   Family History  Problem Relation Age of Onset   Hypothyroidism Mother    Diabetes Mother    Cirrhosis Mother    Heart disease Father    Depression Father  Alcohol abuse Sister    Heart disease Maternal Grandfather    Stroke Maternal Grandfather    Heart disease Paternal Grandfather    Heart disease Paternal Grandmother    Heart disease Maternal Grandmother    Breast cancer Neg Hx    Allergies  Allergen Reactions   Avelox  [Moxifloxacin Hcl In Nacl]     Other reaction(s): Rapid pulse   Iodine     Other reaction(s): Hives   Penicillin G Rash    Patient Care Team: Birdie Sons, MD as PCP - General (Family Medicine) Margaretha Sheffield, MD (Otolaryngology)   Medications: Outpatient Medications Prior to Visit  Medication Sig   ALPRAZolam (XANAX) 0.25 MG tablet TAKE 1 TABLET (0.25 MG TOTAL) BY MOUTH AT BEDTIME AS NEEDED FOR ANXIETY.   diphenoxylate-atropine (LOMOTIL) 2.5-0.025 MG tablet TAKE 1-2 TABLETS BY MOUTH 4 (FOUR) TIMES DAILY AS NEEDED FOR DIARRHEA OR LOOSE STOOLS.   fluticasone (FLONASE) 50 MCG/ACT nasal spray Place 1 spray into both  nostrils 2 (two) times daily.   levothyroxine (SYNTHROID) 88 MCG tablet Take 88 mcg by mouth daily.   omeprazole (PRILOSEC) 40 MG capsule TAKE 1 CAPSULE BY MOUTH EVERY DAY   sertraline (ZOLOFT) 50 MG tablet Take 1 tablet (50 mg total) by mouth daily.   No facility-administered medications prior to visit.    Review of Systems  Constitutional:  Positive for fatigue and unexpected weight change.  Cardiovascular:  Positive for palpitations.  Gastrointestinal:  Positive for diarrhea.  Endocrine: Positive for cold intolerance and heat intolerance.  Genitourinary:  Positive for urgency and vaginal pain.  Hematological:  Positive for adenopathy.  Psychiatric/Behavioral:  Positive for decreased concentration and sleep disturbance.   All other systems reviewed and are negative.    Objective    BP 134/82    Pulse 83    Resp 15    Ht 5\' 3"  (1.6 m)    Wt 158 lb 3.2 oz (71.8 kg)    LMP 03/16/2015    SpO2 100%    BMI 28.02 kg/m    Physical Exam Vitals and nursing note reviewed.  Constitutional:      General: She is awake. She is not in acute distress.    Appearance: Normal appearance. She is well-developed, well-groomed and overweight. She is not ill-appearing, toxic-appearing or diaphoretic.  HENT:     Head: Normocephalic and atraumatic.     Jaw: There is normal jaw occlusion. No trismus, tenderness, swelling or pain on movement.     Right Ear: Hearing, tympanic membrane, ear canal and external ear normal. There is no impacted cerumen.     Left Ear: Hearing, tympanic membrane, ear canal and external ear normal. There is no impacted cerumen.     Nose: Nose normal. No congestion or rhinorrhea.     Right Turbinates: Not enlarged, swollen or pale.     Left Turbinates: Not enlarged, swollen or pale.     Right Sinus: No maxillary sinus tenderness or frontal sinus tenderness.     Left Sinus: No maxillary sinus tenderness or frontal sinus tenderness.     Mouth/Throat:     Lips: Pink.     Mouth:  Mucous membranes are moist. No injury.     Tongue: No lesions.     Pharynx: Oropharynx is clear. Uvula midline. No pharyngeal swelling, oropharyngeal exudate, posterior oropharyngeal erythema or uvula swelling.     Tonsils: No tonsillar exudate or tonsillar abscesses.  Eyes:     General: Lids are normal. Lids are  everted, no foreign bodies appreciated. Vision grossly intact. Gaze aligned appropriately. No allergic shiner or visual field deficit.       Right eye: No discharge.        Left eye: No discharge.     Extraocular Movements: Extraocular movements intact.     Conjunctiva/sclera: Conjunctivae normal.     Right eye: Right conjunctiva is not injected. No exudate.    Left eye: Left conjunctiva is not injected. No exudate.    Pupils: Pupils are equal, round, and reactive to light.  Neck:     Thyroid: No thyroid mass, thyromegaly or thyroid tenderness.     Vascular: No carotid bruit.     Trachea: Trachea normal.  Cardiovascular:     Rate and Rhythm: Normal rate and regular rhythm.     Pulses: Normal pulses.          Carotid pulses are 2+ on the right side and 2+ on the left side.      Radial pulses are 2+ on the right side and 2+ on the left side.       Dorsalis pedis pulses are 2+ on the right side and 2+ on the left side.       Posterior tibial pulses are 2+ on the right side and 2+ on the left side.     Heart sounds: Normal heart sounds, S1 normal and S2 normal. No murmur heard.   No friction rub. No gallop.  Pulmonary:     Effort: Pulmonary effort is normal. No respiratory distress.     Breath sounds: Normal breath sounds and air entry. No stridor. No wheezing, rhonchi or rales.  Chest:     Chest wall: Mass and tenderness present.  Breasts:    Tanner Score is 5.       Comments: Breasts: right breast normal without mass, skin or nipple changes or axillary nodes, left breast normal without mass, skin or nipple changes or axillary nodes, risk and benefit of breast self-exam was  discussed  Abdominal:     General: Abdomen is flat. Bowel sounds are normal. There is no distension.     Palpations: Abdomen is soft. There is no mass.     Tenderness: There is no abdominal tenderness. There is no right CVA tenderness, left CVA tenderness, guarding or rebound.     Hernia: No hernia is present.  Genitourinary:    Exam position: Lithotomy position.     Tanner stage (genital): 5.     Vagina: Vaginal discharge and tenderness present.     Adnexa:        Left: Tenderness present.      Comments: Unable to complete exam d/t discomfort from vaginal dryness Musculoskeletal:        General: No swelling, tenderness, deformity or signs of injury. Normal range of motion.     Cervical back: Full passive range of motion without pain, normal range of motion and neck supple. No edema, rigidity or tenderness. No muscular tenderness.     Right lower leg: No edema.     Left lower leg: No edema.  Lymphadenopathy:     Cervical: No cervical adenopathy.     Right cervical: No superficial, deep or posterior cervical adenopathy.    Left cervical: No superficial, deep or posterior cervical adenopathy.     Upper Body:     Left upper body: No supraclavicular, axillary or pectoral adenopathy.  Skin:    General: Skin is warm and dry.     Capillary  Refill: Capillary refill takes less than 2 seconds.     Coloration: Skin is not jaundiced or pale.     Findings: No bruising, erythema, lesion or rash.  Neurological:     General: No focal deficit present.     Mental Status: She is alert and oriented to person, place, and time. Mental status is at baseline.     GCS: GCS eye subscore is 4. GCS verbal subscore is 5. GCS motor subscore is 6.     Sensory: Sensation is intact. No sensory deficit.     Motor: Motor function is intact. No weakness.     Coordination: Coordination is intact. Coordination normal.     Gait: Gait is intact. Gait normal.  Psychiatric:        Attention and Perception: Attention  and perception normal.        Mood and Affect: Mood and affect normal.        Speech: Speech normal.        Behavior: Behavior normal. Behavior is cooperative.        Thought Content: Thought content normal.        Cognition and Memory: Cognition and memory normal.        Judgment: Judgment normal.     Last depression screening scores PHQ 2/9 Scores 03/02/2021 03/26/2020 03/11/2019  PHQ - 2 Score 2 0 0  PHQ- 9 Score 11 6 5    Last fall risk screening Fall Risk  01/30/2019  Falls in the past year? 0  Number falls in past yr: 0  Injury with Fall? 0  Follow up Falls evaluation completed   Last Audit-C alcohol use screening Alcohol Use Disorder Test (AUDIT) 03/02/2021  1. How often do you have a drink containing alcohol? 1  2. How many drinks containing alcohol do you have on a typical day when you are drinking? 0  3. How often do you have six or more drinks on one occasion? 0  AUDIT-C Score 1  Alcohol Brief Interventions/Follow-up -   A score of 3 or more in women, and 4 or more in men indicates increased risk for alcohol abuse, EXCEPT if all of the points are from question 1   No results found for any visits on 03/02/21.  Assessment & Plan    Routine Health Maintenance and Physical Exam  Exercise Activities and Dietary recommendations  Goals   None     Immunization History  Administered Date(s) Administered   Influenza,inj,Quad PF,6+ Mos 03/11/2019   Influenza-Unspecified 02/02/2015, 01/01/2017, 01/31/2018   Tdap 03/10/2013    Health Maintenance  Topic Date Due   COVID-19 Vaccine (1) Never done   Pneumococcal Vaccine 57-75 Years old (1 - PCV) Never done   HIV Screening  Never done   Hepatitis C Screening  Never done   Zoster Vaccines- Shingrix (1 of 2) Never done   INFLUENZA VACCINE  06/17/2021 (Originally 10/18/2020)   Fecal DNA (Cologuard)  05/08/2021   PAP SMEAR-Modifier  03/15/2022   MAMMOGRAM  04/20/2022   TETANUS/TDAP  03/11/2023   HPV VACCINES  Aged Out     Discussed health benefits of physical activity, and encouraged her to engage in regular exercise appropriate for her age and condition.  Problem List Items Addressed This Visit       Digestive   Irritable bowel syndrome    Chronic, stable       Relevant Medications   scopolamine (TRANSDERM-SCOP) 1 MG/3DAYS     Endocrine   Hypothyroidism, postop  Repeat labs given weight loss Out source mgmt by Ladene Artist       Relevant Orders   TSH + free T4     Genitourinary   Vulvar atrophy    Unable to do exam d/t discomfort Trial of oral therapy       Relevant Medications   estrogens, conjugated, (PREMARIN) 0.625 MG tablet     Other   Anxiety    Address; denies wishing to start additional medication or seeking counseling       Insomnia    Stable at this time; has been intermittent wax/wane d/t stressors      Vitamin D deficiency    Repeat labs      Relevant Orders   Vitamin D (25 hydroxy)   Unexplained weight loss    Down 23# Breast tenderness and cysts noted in L breast US and dx mammo ordered Recommend lab work      Relevant Orders   Comprehensive metabolic panel   Hemoglobin A1c   CBC with Differential/Platelet   Annual physical exam - Primary    Things to do to keep yourself healthy  - Exercise at least 30-45 minutes a day, 3-4 days a week.  - Eat a low-fat diet with lots of fruits and vegetables, up to 7-9 servings per day.  - Seatbelts can save your life. Wear them always.  - Smoke detectors on every level of your home, check batteries every year.  - Eye Doctor - have an eye exam every 1-2 years  - Safe sex - if you may be exposed to STDs, use a condom.  - Alcohol -  If you drink, do it moderately, less than 2 drinks per day.  - Temperance. Choose someone to speak for you if you are not able.  - Depression is common in our stressful world.If you're feeling down or losing interest in things you normally enjoy, please come in for a  visit.  - Violence - If anyone is threatening or hurting you, please call immediately.        Painful lumpy left breast    10-2 location Cyst like Symptoms wax/wane      Relevant Orders   MM DIAG BREAST TOMO BILATERAL   US BREAST LTD UNI LEFT INC AXILLA   Colon cancer screening    IBS; due for screening      Relevant Orders   Cologuard   Motion sickness    Request for flight upcoming      Relevant Medications   scopolamine (TRANSDERM-SCOP) 1 MG/3DAYS   RESOLVED: Encounter for screening mammogram for malignant neoplasm of breast     No follow-ups on file.     Vonna Kotyk, FNP, have reviewed all documentation for this visit. The documentation on 03/02/21 for the exam, diagnosis, procedures, and orders are all accurate and complete.    Gwyneth Sprout, [place] [phone] (phone) [phone] (fax)  [place]

## 2021-03-02 NOTE — Assessment & Plan Note (Signed)
10-2 location Cyst like Symptoms wax/wane

## 2021-03-02 NOTE — Assessment & Plan Note (Signed)
Repeat labs given weight loss Out source mgmt by [employer]

## 2021-03-02 NOTE — Assessment & Plan Note (Signed)

## 2021-03-02 NOTE — Assessment & Plan Note (Signed)
Address; denies wishing to start additional medication or seeking counseling

## 2021-03-02 NOTE — Assessment & Plan Note (Signed)
Stable at this time; has been intermittent wax/wane d/t stressors

## 2021-03-03 ENCOUNTER — Encounter: Payer: Self-pay | Admitting: Family Medicine

## 2021-03-05 ENCOUNTER — Other Ambulatory Visit: Payer: Self-pay | Admitting: Family Medicine

## 2021-03-05 DIAGNOSIS — N905 Atrophy of vulva: Secondary | ICD-10-CM

## 2021-03-05 LAB — COMPREHENSIVE METABOLIC PANEL
ALT: 23 IU/L (ref 0–32)
AST: 25 IU/L (ref 0–40)
Albumin/Globulin Ratio: 2.1 (ref 1.2–2.2)
Albumin: 4.5 g/dL (ref 3.8–4.9)
Alkaline Phosphatase: 85 IU/L (ref 44–121)
BUN/Creatinine Ratio: 10 (ref 9–23)
BUN: 6 mg/dL (ref 6–24)
Bilirubin Total: 0.4 mg/dL (ref 0.0–1.2)
CO2: 25 mmol/L (ref 20–29)
Calcium: 8.9 mg/dL (ref 8.7–10.2)
Chloride: 103 mmol/L (ref 96–106)
Creatinine, Ser: 0.6 mg/dL (ref 0.57–1.00)
Globulin, Total: 2.1 g/dL (ref 1.5–4.5)
Glucose: 100 mg/dL — ABNORMAL HIGH (ref 70–99)
Potassium: 4.1 mmol/L (ref 3.5–5.2)
Sodium: 143 mmol/L (ref 134–144)
Total Protein: 6.6 g/dL (ref 6.0–8.5)
eGFR: 104 mL/min/{1.73_m2} (ref >59)

## 2021-03-05 LAB — CBC WITH DIFFERENTIAL/PLATELET
Basophils Absolute: 0 10*3/uL (ref 0.0–0.2)
Basos: 0 %
EOS (ABSOLUTE): 0.1 10*3/uL (ref 0.0–0.4)
Eos: 1 %
Hematocrit: 38.4 % (ref 34.0–46.6)
Hemoglobin: 13 g/dL (ref 11.1–15.9)
Immature Grans (Abs): 0 10*3/uL (ref 0.0–0.1)
Immature Granulocytes: 0 %
Lymphocytes Absolute: 1.6 10*3/uL (ref 0.7–3.1)
Lymphs: 25 %
MCH: 31.6 pg (ref 26.6–33.0)
MCHC: 33.9 g/dL (ref 31.5–35.7)
MCV: 93 fL (ref 79–97)
Monocytes Absolute: 0.5 10*3/uL (ref 0.1–0.9)
Monocytes: 8 %
Neutrophils Absolute: 4.1 10*3/uL (ref 1.4–7.0)
Neutrophils: 66 %
Platelets: 261 10*3/uL (ref 150–450)
RBC: 4.12 x10E6/uL (ref 3.77–5.28)
RDW: 12.8 % (ref 11.7–15.4)
WBC: 6.3 10*3/uL (ref 3.4–10.8)

## 2021-03-05 LAB — VITAMIN D 25 HYDROXY (VIT D DEFICIENCY, FRACTURES): Vit D, 25-Hydroxy: 18.8 ng/mL — ABNORMAL LOW (ref 30.0–100.0)

## 2021-03-05 LAB — TSH+FREE T4
Free T4: 1.19 ng/dL (ref 0.82–1.77)
TSH: 0.653 u[IU]/mL (ref 0.450–4.500)

## 2021-03-05 LAB — HEMOGLOBIN A1C
Est. average glucose Bld gHb Est-mCnc: 103 mg/dL
Hgb A1c MFr Bld: 5.2 % (ref 4.8–5.6)

## 2021-03-07 ENCOUNTER — Other Ambulatory Visit: Payer: Self-pay

## 2021-03-07 ENCOUNTER — Other Ambulatory Visit: Payer: Self-pay | Admitting: Family Medicine

## 2021-03-07 DIAGNOSIS — N905 Atrophy of vulva: Secondary | ICD-10-CM

## 2021-03-07 MED ORDER — VITAMIN D (ERGOCALCIFEROL) 1.25 MG (50000 UNIT) PO CAPS: 50000.0000 [IU] | ORAL_CAPSULE | ORAL | 0 refills | Status: DC

## 2021-03-07 MED ORDER — ESTROGENS CONJUGATED 0.625 MG PO TABS: ORAL_TABLET | ORAL | 0 refills | Status: DC

## 2021-03-07 NOTE — Progress Notes (Signed)
Hello,    Your lab results have returned. It was a pleasure to see you in the office the other day.  All labs are normal/stable with exception of vitamin D. I would like to start you on a once per week high dose supplement; best taken with a meal. Plan to recheck labs within 12 weeks following use.  Enjoy your holidays.  Please let us know if you have any questions.  Thank you,  Tally Joe, FNP

## 2021-03-07 NOTE — Telephone Encounter (Signed)
LOV: 03/02/2021  NOV: none  Last Refill:  03/02/2021 # 5 0 Refills.

## 2021-03-07 NOTE — Telephone Encounter (Signed)
Patient reached out to office through my chart requesting refill on Premarin. According your your past response on mychart you advised that patient should be taking this medication daily. When prescription was last filled you had sig at take every other day for 20 days. If your wanting patient to take daily okay to send in a 90 prescription? KW

## 2021-03-08 ENCOUNTER — Encounter: Payer: Self-pay | Admitting: *Deleted

## 2021-03-08 ENCOUNTER — Other Ambulatory Visit: Payer: Self-pay | Admitting: Family Medicine

## 2021-03-08 DIAGNOSIS — N905 Atrophy of vulva: Secondary | ICD-10-CM

## 2021-03-09 ENCOUNTER — Other Ambulatory Visit: Payer: Self-pay | Admitting: Family Medicine

## 2021-03-09 ENCOUNTER — Other Ambulatory Visit: Payer: Self-pay

## 2021-03-09 DIAGNOSIS — N905 Atrophy of vulva: Secondary | ICD-10-CM

## 2021-03-09 MED ORDER — ESTROGENS CONJUGATED 0.625 MG PO TABS
ORAL_TABLET | ORAL | 0 refills | Status: DC
  Filled 2021-03-09: qty 5, 10d supply, fill #0

## 2021-03-09 MED ORDER — ESTROGENS CONJUGATED 0.625 MG PO TABS: ORAL_TABLET | ORAL | 0 refills | Status: DC

## 2021-03-09 NOTE — Telephone Encounter (Signed)
Copied from [place] [id number]. Topic: Quick Communication - Rx Refill/Question >> Mar 09, 2021  4:35 PM Yvette Rack wrote: Pt out of medication and the Rx was sent to the wrong pharmacy. Pt requests Rx be sent today  Medication: estrogens, conjugated, (PREMARIN) 0.625 MG tablet  Has the patient contacted their pharmacy? No. (Agent: If no, request that the patient contact the pharmacy for the refill. If patient does not wish to contact the pharmacy document the reason why and proceed with request.) (Agent: If yes, when and what did the pharmacy advise?)  Preferred Pharmacy (with phone number or street name): [id number] [place], [place] - [address]  Phone: [phone] Fax: [phone]  Has the patient been seen for an appointment in the last year OR does the patient have an upcoming appointment? Yes.    Agent: Please be advised that RX refills may take up to 3 business days. We ask that you follow-up with your pharmacy.

## 2021-03-09 NOTE — Telephone Encounter (Signed)
Requested medication (s) are due for refill today: no  Requested medication (s) are on the active medication list: yes  Last refill:  03/09/21  Future visit scheduled: no  Notes to clinic:  please advise: Pharmacy comment: Alternative Requested:THE PRESCRIBED MEDICATION IS NOT COVERED BY INSURANCE. PLEASE CONSIDER CHANGING TO ONE OF THE SUGGESTED COVERED ALTERNATIVES.     Requested Prescriptions  Pending Prescriptions Disp Refills   estradiol (ESTRACE) 1 MG tablet [Pharmacy Med Name: ESTRADIOL 1 MG TABLET]  0     OB/GYN:  Estrogens Passed - 03/09/2021  5:58 PM      Passed - Mammogram is up-to-date per Health Maintenance      Passed - Last BP in normal range    BP Readings from Last 1 Encounters:  03/02/21 134/82          Passed - Valid encounter within last 12 months    Recent Outpatient Visits           1 week ago Annual physical exam   [facility] Gwyneth Sprout, FNP   11 months ago Memory problem   [facility] Birdie Sons, MD   1 year ago Left foot pain   [place] Fl[facility]um, Kelby Aline, FNP   1 year ago Irritable bowel syndrome with diarrhea   [facility] Birdie Sons, MD   1 year ago Annual physical exam   [facility] Trinna Post, [place]       Future Appointments             In 8 months Laurence Ferrari, [place], [place]

## 2021-03-10 ENCOUNTER — Encounter: Payer: Self-pay | Admitting: Family Medicine

## 2021-03-10 ENCOUNTER — Other Ambulatory Visit: Payer: Self-pay

## 2021-03-10 DIAGNOSIS — G4700 Insomnia, unspecified: Secondary | ICD-10-CM

## 2021-03-10 DIAGNOSIS — N905 Atrophy of vulva: Secondary | ICD-10-CM

## 2021-03-10 MED ORDER — ESTROGENS CONJUGATED 0.625 MG PO TABS: ORAL_TABLET | ORAL | 2 refills | Status: DC

## 2021-03-10 NOTE — Telephone Encounter (Signed)
Patient caed in to clarify whether [facility]  [id number] [place], [place] - [address] Phone:  [phone]  Fax:  [phone]

## 2021-03-10 NOTE — Telephone Encounter (Signed)
Requested medication (s) are due for refill today:   Yes  Requested medication (s) are on the active medication list:   No prescribed yesterday by Dr. Caryn Section  Future visit scheduled:   No Seen a wk ago by Tally Joe   Last ordered: Yesterday 12/21  Returned because pharmacy requesting an alternative.  Not covered by insur.   Requested Prescriptions  Pending Prescriptions Disp Refills   estradiol (ESTRACE) 1 MG tablet [Pharmacy Med Name: ESTRADIOL 1 MG TABLET]  0     OB/GYN:  Estrogens Passed - 03/10/2021  9:59 AM      Passed - Mammogram is up-to-date per Health Maintenance      Passed - Last BP in normal range    BP Readings from Last 1 Encounters:  03/02/21 134/82          Passed - Valid encounter within last 12 months    Recent Outpatient Visits           1 week ago Annual physical exam   [facility] Gwyneth Sprout, FNP   11 months ago Memory problem   [facility] Birdie Sons, MD   1 year ago Left foot pain   [place] Fl[facility]um, Kelby Aline, FNP   1 year ago Irritable bowel syndrome with diarrhea   [facility] Birdie Sons, MD   2 years ago Annual physical exam   [facility] Trinna Post, [place]       Future Appointments             In 8 months Laurence Ferrari, [place], [place]

## 2021-03-11 ENCOUNTER — Encounter: Payer: Self-pay | Admitting: Family Medicine

## 2021-03-11 DIAGNOSIS — N905 Atrophy of vulva: Secondary | ICD-10-CM

## 2021-03-15 ENCOUNTER — Other Ambulatory Visit: Payer: Self-pay | Admitting: Family Medicine

## 2021-03-15 ENCOUNTER — Other Ambulatory Visit: Payer: BC Managed Care – PPO

## 2021-03-15 DIAGNOSIS — N905 Atrophy of vulva: Secondary | ICD-10-CM

## 2021-03-16 MED ORDER — ESTRADIOL 1 MG PO TABS: 1.0000 mg | ORAL_TABLET | Freq: Every day | ORAL | 3 refills | Status: DC

## 2021-03-17 ENCOUNTER — Encounter: Payer: Self-pay | Admitting: *Deleted

## 2021-03-22 ENCOUNTER — Inpatient Hospital Stay: Payer: BC Managed Care – PPO

## 2021-03-22 ENCOUNTER — Inpatient Hospital Stay: Payer: BC Managed Care – PPO | Attending: Oncology | Admitting: Oncology

## 2021-03-22 ENCOUNTER — Ambulatory Visit
Admission: RE | Admit: 2021-03-22 | Discharge: 2021-03-22 | Disposition: A | Discharge status: Home / Self Care | Payer: BC Managed Care – PPO | Source: Ambulatory Visit | Attending: Family Medicine | Admitting: Family Medicine

## 2021-03-22 ENCOUNTER — Encounter: Payer: Self-pay | Admitting: Oncology

## 2021-03-22 ENCOUNTER — Other Ambulatory Visit: Payer: Self-pay

## 2021-03-22 VITALS — BP 155/94 | HR 80 | Temp 96.4°F | Resp 18 | Ht 63.0 in | Wt 159.9 lb

## 2021-03-22 DIAGNOSIS — Z87891 Personal history of nicotine dependence: Secondary | ICD-10-CM | POA: Diagnosis not present

## 2021-03-22 DIAGNOSIS — N644 Mastodynia: Secondary | ICD-10-CM | POA: Insufficient documentation

## 2021-03-22 DIAGNOSIS — N951 Menopausal and female climacteric states: Secondary | ICD-10-CM | POA: Diagnosis not present

## 2021-03-22 DIAGNOSIS — R5382 Chronic fatigue, unspecified: Secondary | ICD-10-CM | POA: Insufficient documentation

## 2021-03-22 DIAGNOSIS — R634 Abnormal weight loss: Secondary | ICD-10-CM | POA: Diagnosis not present

## 2021-03-22 DIAGNOSIS — N6320 Unspecified lump in the left breast, unspecified quadrant: Secondary | ICD-10-CM | POA: Insufficient documentation

## 2021-03-22 NOTE — Progress Notes (Signed)
Hematology/Oncology Consult note [phone] [phone]      Patient Care Team: Birdie Sons, MD as PCP - General (Family Medicine) Margaretha Sheffield, MD (Otolaryngology)  REFERRING PROVIDER: Gwyneth Sprout, FNP  CHIEF COMPLAINTS/REASON FOR VISIT:  Evaluation of unintentional weight loss  HISTORY OF PRESENTING ILLNESS:   Lisa Simmons is a  61 y.o.  female with PMH listed below was seen in consultation at the request of  Gwyneth Sprout, FNP  for evaluation of unintentional weight loss of 20 pounds since August 2022. Patient had thyroid function checked at PCPs office and were normal.  Patient denies fever.  She has had chronic hot flashes and sweating due to menopause.  Patient reports that she has been eating normal portion for lunch and dinner.  She has cut out her breakfast and has been on intermittent fasting since September 2022.  She feels well otherwise. Chronic fatigue, unchanged. Patient has left breast tenderness and has had mammogram done this morning. Patient has not started colonoscopy screening.  She has been doing Cologuard and is due currently..  Review of Systems  Constitutional:  Positive for fatigue. Negative for appetite change, chills and fever.  HENT:   Negative for hearing loss and voice change.   Eyes:  Negative for eye problems.  Respiratory:  Negative for chest tightness and cough.   Cardiovascular:  Negative for chest pain.  Gastrointestinal:  Negative for abdominal distention, abdominal pain and blood in stool.  Endocrine: Negative for hot flashes.  Genitourinary:  Negative for difficulty urinating and frequency.   Musculoskeletal:  Negative for arthralgias.  Skin:  Negative for itching and rash.  Neurological:  Negative for extremity weakness.  Hematological:  Negative for adenopathy.  Psychiatric/Behavioral:  Negative for confusion.    MEDICAL HISTORY:  Past Medical History:  Diagnosis Date   Anemia    History of chicken  pox    History of measles    History of mumps    Insomnia    Seborrheic keratoses    Thyroid disease    Vulvar atrophy     SURGICAL HISTORY: Past Surgical History:  Procedure Laterality Date   CESAREAN SECTION  1989   DILATION AND CURETTAGE OF UTERUS  1983   x2   SUPRACERVICAL ABDOMINAL HYSTERECTOMY  2003   due to Endometriosis and severe Anemia, Laproscopic; still has ovaries and cervix   THYROIDECTOMY  06/2012   Dr. Jerral Bonito; due to multinodular goiter   TONSILLECTOMY  1970    SOCIAL HISTORY: Social History   Socioeconomic History   Marital status: Married    Spouse name: Not on file   Number of children: 3   Years of education: Not on file   Highest education level: Not on file  Occupational History   Occupation: Teacher  Tobacco Use   Smoking status: Former    Packs/day: 1.00    Years: 7.00    Pack years: 7.00    Types: Cigarettes    Quit date: 03/20/1986    Years since quitting: 35.0   Smokeless tobacco: Never  Vaping Use   Vaping Use: Never used  Substance and Sexual Activity   Alcohol use: Yes    Alcohol/week: 0.0 standard drinks    Comment: 1-2 drinks per year   Drug use: No   Sexual activity: Not on file  Other Topics Concern   Not on file  Social History Narrative   Not on file   Social Determinants of Health   Financial Resource  Strain: Not on file  Food Insecurity: Not on file  Transportation Needs: Not on file  Physical Activity: Not on file  Stress: Not on file  Social Connections: Not on file  Intimate Partner Violence: Not on file    FAMILY HISTORY: Family History  Problem Relation Age of Onset   Hypothyroidism Mother    Diabetes Mother    Cirrhosis Mother    Heart disease Father    Depression Father    Cirrhosis Sister    Alcohol abuse Sister    Leukemia Maternal Aunt    Bladder Cancer Paternal Uncle    Heart disease Maternal Grandmother    Heart disease Maternal Grandfather    Stroke Maternal Grandfather    Heart disease  Paternal Grandmother    Heart disease Paternal Grandfather    Breast cancer Neg Hx     ALLERGIES:  is allergic to avelox  [moxifloxacin hcl in nacl], iodine, and penicillin g.  MEDICATIONS:  Current Outpatient Medications  Medication Sig Dispense Refill   ALPRAZolam (XANAX) 0.25 MG tablet TAKE 1 TABLET (0.25 MG TOTAL) BY MOUTH AT BEDTIME AS NEEDED FOR ANXIETY. 20 tablet 1   diphenoxylate-atropine (LOMOTIL) 2.5-0.025 MG tablet TAKE 1-2 TABLETS BY MOUTH 4 (FOUR) TIMES DAILY AS NEEDED FOR DIARRHEA OR LOOSE STOOLS. 60 tablet 5   estradiol (ESTRACE) 1 MG tablet Take 1 tablet (1 mg total) by mouth daily. 90 tablet 3   levothyroxine (SYNTHROID) 88 MCG tablet Take 88 mcg by mouth daily.     sertraline (ZOLOFT) 50 MG tablet Take 1 tablet (50 mg total) by mouth daily. 90 tablet 4   Vitamin D, Ergocalciferol, (DRISDOL) 1.25 MG (50000 UNIT) CAPS capsule Take 1 capsule (50,000 Units total) by mouth every 7 (seven) days. 13 capsule 0   fluticasone (FLONASE) 50 MCG/ACT nasal spray Place 1 spray into both nostrils 2 (two) times daily. (Patient not taking: Reported on 03/22/2021)     omeprazole (PRILOSEC) 40 MG capsule TAKE 1 CAPSULE BY MOUTH EVERY DAY (Patient not taking: Reported on 03/22/2021) 90 capsule 4   scopolamine (TRANSDERM-SCOP) 1 MG/3DAYS Place 1 patch (1.5 mg total) onto the skin every 3 (three) days. (Patient not taking: Reported on 03/22/2021) 10 patch 0   No current facility-administered medications for this visit.     PHYSICAL EXAMINATION: ECOG PERFORMANCE STATUS: 0 Vitals:   03/22/21 1055  BP: (!) 155/94  Pulse: 80  Resp: 18  Temp: (!) 96.4 F (35.8 C)   Filed Weights   03/22/21 1055  Weight: 159 lb 14.4 oz (72.5 kg)    Physical Exam  LABORATORY DATA:  I have reviewed the data as listed Lab Results  Component Value Date   WBC 6.3 03/04/2021   HGB 13.0 03/04/2021   HCT 38.4 03/04/2021   MCV 93 03/04/2021   PLT 261 03/04/2021   Recent Labs    03/26/20 0934 03/04/21 1125   NA 141 143  K 3.8 4.1  CL 101 103  CO2 24 25  GLUCOSE 106* 100*  BUN 7 6  CREATININE 0.82 0.60  CALCIUM 9.2 8.9  GFRNONAA 80  --   GFRAA 92  --   PROT 6.9 6.6  ALBUMIN 4.6 4.5  AST 24 25  ALT 23 23  ALKPHOS 84 85  BILITOT 0.4 0.4   Iron/TIBC/Ferritin/ %Sat    Component Value Date/Time   FERRITIN 110 03/26/2020 0934      RADIOGRAPHIC STUDIES: I have personally reviewed the radiological images as listed and agreed with  the findings in the report. US BREAST LTD UNI LEFT INC AXILLA  Result Date: 03/22/2021 CLINICAL DATA:  Painful area in the LEFT breast from 10-2 o'clock for 2 months and a pea-sized lump at 1 o'clock for 1 month. EXAM: DIGITAL DIAGNOSTIC BILATERAL MAMMOGRAM WITH TOMOSYNTHESIS AND CAD; ULTRASOUND LEFT BREAST LIMITED TECHNIQUE: Bilateral digital diagnostic mammography and breast tomosynthesis was performed. The images were evaluated with computer-aided detection.; Targeted ultrasound examination of the left breast was performed. COMPARISON:  Previous exam(s). ACR Breast Density Category b: There are scattered areas of fibroglandular density. FINDINGS: Spot compression tomosynthesis views were obtained over the palpable area of concern in the LEFT breast. No suspicious mammographic finding is identified in this area. No suspicious mammographic etiology of LEFT breast pain identified. No suspicious mass, microcalcification, or other finding is identified in the LEFT breast. No suspicious mass, distortion, or microcalcifications are identified to suggest presence of malignancy in the RIGHT breast. On physical exam, no suspicious mass is appreciated. Targeted LEFT breast ultrasound was performed in the palpable/painful area of concern at the upper breast. No suspicious solid or cystic mass is identified. At 12 o'clock 1 cm from the nipple, there is an oval circumscribed anechoic mass with several thin internal septations and posterior acoustic enhancement. This measures 8 x 6 x 7  mm, previously 18 x 9 by 18 mm when it was previously characterized as a benign cyst in 2019. This is consistent with a benign mildly complicated cyst which may be inflamed. IMPRESSION: 1. No mammographic or sonographic evidence of malignancy at the site of painful/palpable concern in the LEFT breast. There is a mildly complicated cyst in the LEFT retroareolar breast near the site of most painful concern. This has decreased in size in comparison to prior in 2019. Any further workup of the patient's symptoms should be based on the clinical assessment. Recommend routine annual screening mammogram in 1 year. 2. No mammographic evidence of malignancy bilaterally. RECOMMENDATION: Screening mammogram in one year.(Code:SM-B-01Y) I have discussed the findings and recommendations with the patient. If applicable, a reminder letter will be sent to the patient regarding the next appointment. BI-RADS CATEGORY  2: Benign. Electronically Signed   By: Valentino Saxon M.D.   On: 03/22/2021 10:17  MM DIAG BREAST TOMO BILATERAL  Result Date: 03/22/2021 CLINICAL DATA:  Painful area in the LEFT breast from 10-2 o'clock for 2 months and a pea-sized lump at 1 o'clock for 1 month. EXAM: DIGITAL DIAGNOSTIC BILATERAL MAMMOGRAM WITH TOMOSYNTHESIS AND CAD; ULTRASOUND LEFT BREAST LIMITED TECHNIQUE: Bilateral digital diagnostic mammography and breast tomosynthesis was performed. The images were evaluated with computer-aided detection.; Targeted ultrasound examination of the left breast was performed. COMPARISON:  Previous exam(s). ACR Breast Density Category b: There are scattered areas of fibroglandular density. FINDINGS: Spot compression tomosynthesis views were obtained over the palpable area of concern in the LEFT breast. No suspicious mammographic finding is identified in this area. No suspicious mammographic etiology of LEFT breast pain identified. No suspicious mass, microcalcification, or other finding is identified in the LEFT  breast. No suspicious mass, distortion, or microcalcifications are identified to suggest presence of malignancy in the RIGHT breast. On physical exam, no suspicious mass is appreciated. Targeted LEFT breast ultrasound was performed in the palpable/painful area of concern at the upper breast. No suspicious solid or cystic mass is identified. At 12 o'clock 1 cm from the nipple, there is an oval circumscribed anechoic mass with several thin internal septations and posterior acoustic enhancement. This measures 8 x  6 x 7 mm, previously 18 x 9 by 18 mm when it was previously characterized as a benign cyst in 2019. This is consistent with a benign mildly complicated cyst which may be inflamed. IMPRESSION: 1. No mammographic or sonographic evidence of malignancy at the site of painful/palpable concern in the LEFT breast. There is a mildly complicated cyst in the LEFT retroareolar breast near the site of most painful concern. This has decreased in size in comparison to prior in 2019. Any further workup of the patient's symptoms should be based on the clinical assessment. Recommend routine annual screening mammogram in 1 year. 2. No mammographic evidence of malignancy bilaterally. RECOMMENDATION: Screening mammogram in one year.(Code:SM-B-01Y) I have discussed the findings and recommendations with the patient. If applicable, a reminder letter will be sent to the patient regarding the next appointment. BI-RADS CATEGORY  2: Benign. Electronically Signed   By: Valentino Saxon M.D.   On: 03/22/2021 10:17     ASSESSMENT & PLAN:  1. Weight loss    #Weight loss, Reviewed patient recent blood work.  Patient has a normal thyroid function.  CBC showed no leukocytosis, normal hemoglobin and platelet count.  Normal differential.  Physical examination is nonremarkable. Her weight loss could be explained by intermittent fasting.  I recommend patient to temporarily hold off intermittent fasting, monitor her weight.  If she  quickly starts to gain weight, her current weight gain could be explained by intermittent fasting and she would not need additional image work-up.  If she continues to lose weight despite stopping intermittent fasting, weight loss will be considered to be unintentional which will warrant additional work-up.  Patient agrees with the plan.  She will follow-up with primary care provider and ask for follow-up in 3 months.  She knows that she can call office if she continues to lose weight despite stopping intermittent fasting.  Left breast tenderness. Patient had left diagnostic mammogram/ultrasound done this a.m. and there was no radiographic evidence of malignancy at the site of palpable tenderness in the left breast.  Patient should continue annual routine screening mammogram.  All questions were answered. The patient knows to call the clinic with any problems questions or concerns.  cc Gwyneth Sprout, FNP    Return of visit: Follow-up as needed Thank you for this kind referral and the opportunity to participate in the care of this patient. A copy of today's note is routed to referring provider   Earlie Server, MD, PhD [facility] Hematology Oncology 03/22/2021

## 2021-03-22 NOTE — Progress Notes (Signed)
Patient here for evaluation due to unexplained weight loss. Pt reports loss of 20Lbs since August.

## 2021-04-05 ENCOUNTER — Other Ambulatory Visit: Payer: Self-pay | Admitting: Family Medicine

## 2021-04-05 DIAGNOSIS — G4700 Insomnia, unspecified: Secondary | ICD-10-CM

## 2021-04-05 NOTE — Telephone Encounter (Signed)
Requested medications are due for refill today.  yes  Requested medications are on the active medications list.  yes  Last refill. 09/04/2020  Future visit scheduled.   no  Notes to clinic.  Medication not delegated.    Requested Prescriptions  Pending Prescriptions Disp Refills   ALPRAZolam (XANAX) 0.25 MG tablet [Pharmacy Med Name: ALPRAZOLAM 0.25 MG TABLET] 20 tablet     Sig: TAKE 1 TABLET BY MOUTH AT BEDTIME AS NEEDED FOR ANXIETY.     Not Delegated - Psychiatry:  Anxiolytics/Hypnotics Failed - 04/05/2021  6:48 PM      Failed - This refill cannot be delegated      Failed - Urine Drug Screen completed in last 360 days      Passed - Valid encounter within last 6 months    Recent Outpatient Visits           1 month ago Annual physical exam   [facility] Gwyneth Sprout, FNP   1 year ago Memory problem   [facility] Birdie Sons, MD   1 year ago Left foot pain   [place] [facility], Kelby Aline, FNP   1 year ago Irritable bowel syndrome with diarrhea   [facility] Birdie Sons, MD   2 years ago Annual physical exam   [facility] Trinna Post, [place]       Future Appointments             In 7 months Laurence Ferrari, [place], [place]

## 2021-04-08 ENCOUNTER — Other Ambulatory Visit: Payer: Self-pay

## 2021-04-29 ENCOUNTER — Other Ambulatory Visit: Payer: Self-pay | Admitting: Family Medicine

## 2021-05-18 LAB — COLOGUARD: COLOGUARD: NEGATIVE

## 2021-06-08 ENCOUNTER — Encounter: Payer: Self-pay | Admitting: Family Medicine

## 2021-06-14 ENCOUNTER — Other Ambulatory Visit: Payer: Self-pay | Admitting: Family Medicine

## 2021-06-14 DIAGNOSIS — M791 Myalgia, unspecified site: Secondary | ICD-10-CM

## 2021-06-14 DIAGNOSIS — E559 Vitamin D deficiency, unspecified: Secondary | ICD-10-CM

## 2021-07-05 ENCOUNTER — Other Ambulatory Visit: Payer: Self-pay | Admitting: Family Medicine

## 2021-07-05 LAB — COMPREHENSIVE METABOLIC PANEL
ALT: 14 IU/L (ref 0–32)
AST: 17 IU/L (ref 0–40)
Albumin/Globulin Ratio: 1.9 (ref 1.2–2.2)
Albumin: 4.4 g/dL (ref 3.8–4.9)
Alkaline Phosphatase: 83 IU/L (ref 44–121)
BUN/Creatinine Ratio: 14 (ref 9–23)
BUN: 9 mg/dL (ref 6–24)
Bilirubin Total: 0.3 mg/dL (ref 0.0–1.2)
CO2: 22 mmol/L (ref 20–29)
Calcium: 9.1 mg/dL (ref 8.7–10.2)
Chloride: 102 mmol/L (ref 96–106)
Creatinine, Ser: 0.64 mg/dL (ref 0.57–1.00)
Globulin, Total: 2.3 g/dL (ref 1.5–4.5)
Glucose: 102 mg/dL — ABNORMAL HIGH (ref 70–99)
Potassium: 4.4 mmol/L (ref 3.5–5.2)
Sodium: 142 mmol/L (ref 134–144)
Total Protein: 6.7 g/dL (ref 6.0–8.5)
eGFR: 102 mL/min/{1.73_m2} (ref >59)

## 2021-07-05 LAB — HEPATIC FUNCTION PANEL: Bilirubin, Direct: <0.1 mg/dL (ref 0.00–0.40)

## 2021-07-05 LAB — VITAMIN D 25 HYDROXY (VIT D DEFICIENCY, FRACTURES): Vit D, 25-Hydroxy: 41.9 ng/mL (ref 30.0–100.0)

## 2021-07-06 MED ORDER — VITAMIN D (ERGOCALCIFEROL) 1.25 MG (50000 UNIT) PO CAPS: 50000.0000 [IU] | ORAL_CAPSULE | ORAL | 4 refills | Status: DC

## 2021-08-10 ENCOUNTER — Ambulatory Visit: Payer: BC Managed Care – PPO | Admitting: Dermatology

## 2021-08-23 ENCOUNTER — Encounter: Payer: Self-pay | Admitting: Family Medicine

## 2021-08-23 DIAGNOSIS — N760 Acute vaginitis: Secondary | ICD-10-CM

## 2021-08-24 MED ORDER — FLUCONAZOLE 150 MG PO TABS: 150.0000 mg | ORAL_TABLET | Freq: Once | ORAL | 0 refills | Status: AC

## 2021-09-07 ENCOUNTER — Other Ambulatory Visit: Payer: Self-pay | Admitting: Family Medicine

## 2021-09-07 NOTE — Telephone Encounter (Signed)
Requested medication (s) are due for refill today: yes  Requested medication (s) are on the active medication list: no  Last refill:  06/02/21  Future visit scheduled: no  Notes to clinic:  rx was dc on 06/14/21 by Daneil Dan, NP. Please advise for refill      Requested Prescriptions  Pending Prescriptions Disp Refills   omeprazole (PRILOSEC) 40 MG capsule [Pharmacy Med Name: OMEPRAZOLE DR 40 MG CAPSULE] 90 capsule 4    Sig: TAKE 1 CAPSULE BY MOUTH EVERY DAY     Gastroenterology: Proton Pump Inhibitors Passed - 09/07/2021  1:51 AM      Passed - Valid encounter within last 12 months    Recent Outpatient Visits           6 months ago Annual physical exam   [facility] Gwyneth Sprout, FNP   1 year ago Memory problem   [facility] Birdie Sons, MD   1 year ago Left foot pain   [place] [facility], Kelby Aline, FNP   1 year ago Irritable bowel syndrome with diarrhea   [facility] Birdie Sons, MD   2 years ago Annual physical exam   [facility] Trinna Post, [place]       Future Appointments             In 2 months Laurence Ferrari, [place], [place]

## 2021-09-08 NOTE — Telephone Encounter (Signed)
Per patient she still taking it. She wants this refilled. Ok to do so?

## 2021-09-08 NOTE — Telephone Encounter (Signed)
Please check with patient if she wants this refilled. Looks like she had stopped taking at her office visit in March.

## 2021-09-15 ENCOUNTER — Ambulatory Visit: Payer: BC Managed Care – PPO | Admitting: Dermatology

## 2021-10-30 ENCOUNTER — Other Ambulatory Visit: Payer: Self-pay | Admitting: Family Medicine

## 2021-11-09 ENCOUNTER — Ambulatory Visit: Payer: BC Managed Care – PPO | Admitting: Dermatology

## 2021-11-23 ENCOUNTER — Ambulatory Visit: Payer: BC Managed Care – PPO | Admitting: Dermatology

## 2021-11-23 DIAGNOSIS — L578 Other skin changes due to chronic exposure to nonionizing radiation: Secondary | ICD-10-CM

## 2021-11-23 DIAGNOSIS — Z1283 Encounter for screening for malignant neoplasm of skin: Secondary | ICD-10-CM

## 2021-11-23 DIAGNOSIS — L719 Rosacea, unspecified: Secondary | ICD-10-CM

## 2021-11-23 DIAGNOSIS — L820 Inflamed seborrheic keratosis: Secondary | ICD-10-CM

## 2021-11-23 DIAGNOSIS — L259 Unspecified contact dermatitis, unspecified cause: Secondary | ICD-10-CM

## 2021-11-23 DIAGNOSIS — D229 Melanocytic nevi, unspecified: Secondary | ICD-10-CM

## 2021-11-23 DIAGNOSIS — D1800 Hemangioma unspecified site: Secondary | ICD-10-CM

## 2021-11-23 DIAGNOSIS — L814 Other melanin hyperpigmentation: Secondary | ICD-10-CM

## 2021-11-23 MED ORDER — TRIAMCINOLONE ACETONIDE 0.1 % EX CREA: 1.0000 | TOPICAL_CREAM | Freq: Two times a day (BID) | CUTANEOUS | 0 refills | Status: DC | PRN

## 2021-11-23 NOTE — Patient Instructions (Addendum)
For rash   Can use triamcinolone cream 0.1 - apply as needed twice daily to affected areas of rash can use for bug bites also. Avoid applying to face, groin, and axilla. Use as directed. Long-term use can cause thinning of the skin.  Topical steroids (such as triamcinolone, fluocinolone, fluocinonide, mometasone, clobetasol, halobetasol, betamethasone, hydrocortisone) can cause thinning and lightening of the skin if they are used for too long in the same area. Your physician has selected the right strength medicine for your problem and area affected on the body. Please use your medication only as directed by your physician to prevent side effects.   To prevent rash can purchase over the counter white skin tape wrap affected areas that tend to get rash before wearing silicone band.   Rosacea  What is rosacea? Rosacea (say: ro-zay-sha) is a common skin disease that usually begins as a trend of flushing or blushing easily.  As rosacea progresses, a persistent redness in the center of the face will develop and may gradually spread beyond the nose and cheeks to the forehead and chin.  In some cases, the ears, chest, and back could be affected.  Rosacea may appear as tiny blood vessels or small red bumps that occur in crops.  Frequently they can contain pus, and are called "pustules".  If the bumps do not contain pus, they are referred to as "papules".  Rarely, in prolonged, untreated cases of rosacea, the oil glands of the nose and cheeks may become permanently enlarged.  This is called rhinophyma, and is seen more frequently in men.  Signs and Risks In its beginning stages, rosacea tends to come and go, which makes it difficult to recognize.  It can start as intermittent flushing of the face.  Eventually, blood vessels may become permanently visible.  Pustules and papules can appear, but can be mistaken for adult acne.  People of all races, ages, genders and ethnic groups are at risk of developing  rosacea.  However, it is more common in women (especially around menopause) and adults with fair skin between the ages of 75 and 73.  Treatment Dermatologists typically recommend a combination of treatments to effectively manage rosacea.  Treatment can improve symptoms and may stop the progression of the rosacea.  Treatment may involve both topical and oral medications.  The tetracycline antibiotics are often used for their anti-inflammatory effect; however, because of the possibility of developing antibiotic resistance, they should not be used long term at full dose.  For dilated blood vessels the options include electrodessication (uses electric current through a small needle), laser treatment, and cosmetics to hide the redness.   With all forms of treatment, improvement is a slow process, and patients may not see any results for the first 3-4 weeks.  It is very important to avoid the sun and other triggers.  Patients must wear sunscreen daily.  Skin Care Instructions: Cleanse the skin with a mild soap such as CeraVe cleanser, Cetaphil cleanser, or Dove soap once or twice daily as needed. Moisturize with Eucerin Redness Relief Daily Perfecting Lotion (has a subtle green tint), CeraVe Moisturizing Cream, or Oil of Olay Daily Moisturizer with sunscreen every morning and/or night as recommended. Makeup should be "non-comedogenic" (won't clog pores) and be labeled "for sensitive skin". Good choices for cosmetics are: Neutrogena, Almay, and Physician's Formula.  Any product with a green tint tends to offset a red complexion. If your eyes are dry and irritated, use artificial tears 2-3 times per day and  cleanse the eyelids daily with baby shampoo.  Have your eyes examined at least every 2 years.  Be sure to tell your eye doctor that you have rosacea. Alcoholic beverages tend to cause flushing of the skin, and may make rosacea worse. Always wear sunscreen, protect your skin from extreme hot and cold  temperatures, and avoid spicy foods, hot drinks, and mechanical irritation such as rubbing, scrubbing, or massaging the face.  Avoid harsh skin cleansers, cleansing masks, astringents, and exfoliation. If a particular product burns or makes your face feel tight, then it is likely to flare your rosacea. If you are having difficulty finding a sunscreen that you can tolerate, you may try switching to a chemical-free sunscreen.  These are ones whose active ingredient is zinc oxide or titanium dioxide only.  They should also be fragrance free, non-comedogenic, and labeled for sensitive skin. Rosacea triggers may vary from person to person.  There are a variety of foods that have been reported to trigger rosacea.  Some patients find that keeping a diary of what they were doing when they flared helps them avoid triggers.      Seborrheic Keratosis  What causes seborrheic keratoses? Seborrheic keratoses are harmless, common skin growths that first appear during adult life.  As time goes by, more growths appear.  Some people may develop a large number of them.  Seborrheic keratoses appear on both covered and uncovered body parts.  They are not caused by sunlight.  The tendency to develop seborrheic keratoses can be inherited.  They vary in color from skin-colored to gray, brown, or even black.  They can be either smooth or have a rough, warty surface.   Seborrheic keratoses are superficial and look as if they were stuck on the skin.  Under the microscope this type of keratosis looks like layers upon layers of skin.  That is why at times the top layer may seem to fall off, but the rest of the growth remains and re-grows.    Treatment Seborrheic keratoses do not need to be treated, but can easily be removed in the office.  Seborrheic keratoses often cause symptoms when they rub on clothing or jewelry.  Lesions can be in the way of shaving.  If they become inflamed, they can cause itching, soreness, or burning.   Removal of a seborrheic keratosis can be accomplished by freezing, burning, or surgery. If any spot bleeds, scabs, or grows rapidly, please return to have it checked, as these can be an indication of a skin cancer.       Recommend taking Heliocare sun protection supplement daily in sunny weather for additional sun protection. For maximum protection on the sunniest days, you can take up to 2 capsules of regular Heliocare OR take 1 capsule of Heliocare Ultra. For prolonged exposure (such as a full day in the sun), you can repeat your dose of the supplement 4 hours after your first dose. Heliocare can be purchased at [employer], at some [facility] or at [email or web address].       Melanoma ABCDEs  Melanoma is the most dangerous type of skin cancer, and is the leading cause of death from skin disease.  You are more likely to develop melanoma if you: Have light-colored skin, light-colored eyes, or red or blond hair Spend a lot of time in the sun Tan regularly, either outdoors or in a tanning bed Have had blistering sunburns, especially during childhood Have a close family member who has had a melanoma Have  atypical moles or large birthmarks  Early detection of melanoma is key since treatment is typically straightforward and cure rates are extremely high if we catch it early.   The first sign of melanoma is often a change in a mole or a new dark spot.  The ABCDE system is a way of remembering the signs of melanoma.  A for asymmetry:  The two halves do not match. B for border:  The edges of the growth are irregular. C for color:  A mixture of colors are present instead of an even brown color. D for diameter:  Melanomas are usually (but not always) greater than 94m - the size of a pencil eraser. E for evolution:  The spot keeps changing in size, shape, and color.  Please check your skin once per month between visits. You can use a small mirror in front and a large mirror behind you to  keep an eye on the back side or your body.   If you see any new or changing lesions before your next follow-up, please call to schedule a visit.  Please continue daily skin protection including broad spectrum sunscreen SPF 30+ to sun-exposed areas, reapplying every 2 hours as needed when you're outdoors.   Staying in the shade or wearing long sleeves, sun glasses (UVA+UVB protection) and wide brim hats (4-inch brim around the entire circumference of the hat) are also recommended for sun protection.    Due to recent changes in healthcare laws, you may see results of your pathology and/or laboratory studies on MyChart before the doctors have had a chance to review them. We understand that in some cases there may be results that are confusing or concerning to you. Please understand that not all results are received at the same time and often the doctors may need to interpret multiple results in order to provide you with the best plan of care or course of treatment. Therefore, we ask that you please give u[place]2 business days to thoroughly review all your results before contacting the office for clarification. Should we see a critical lab result, you will be contacted sooner.   If You Need Anything After Your Visit  If you have any questions or concerns for your doctor, please call our main line at [phone] press option 4 to reach your doctor's medical assistant. If no one answers, please leave a voicemail as directed and we will return your call as soon as possible. Messages left after 4 pm will be answered the following business day.   You may also send [place] message via [place] We typically respond to MyChart messages within 1-2 business days.  For prescription refills, please ask your pharmacy to contact our office. Our fax number is [phone]  If you have an urgent issue when the clinic is closed that cannot wait until the next business day, you can page your doctor at the number below.     Please note that while we do our best to be available for urgent issues outside of office hours, we are not available 24/7.   If you have an urgent issue and are unable to reach [place] you may choose to seek medical care at your doctor's office, retail clinic, urgent care center, or emergency room.  If you have a medical emergency, please immediately call 911 or go to the emergency department.  Pager Numbers  - Dr. KNehemiah Massed [phone] - Dr. MLaurence Ferrari [phone] - Dr. SNicole Kindred [phone] In the event  of inclement weather, please call our main line at [phone] for an update on the status of any delays or closures.  Dermatology Medication Tips: Please keep the boxes that topical medications come in in order to help keep track of the instructions about where and how to use these. Pharmacies typically print the medication instructions only on the boxes and not directly on the medication tubes.   If your medication is too expensive, please contact our office at [phone] option 4 or send [place] a message through [place].   We are unable to tell what your co-pay for medications will be in advance as this is different depending on your insurance coverage. However, we may be able to find a substitute medication at lower cost or fill out paperwork to get insurance to cover a needed medication.   If a prior authorization is required to get your medication covered by your insurance company, please allow [place] 1-2 business days to complete this process.  Drug prices often vary depending on where the prescription is filled and some pharmacies may offer cheaper prices.  The website www.goodrx.com contains coupons for medications through different pharmacies. The prices here do not account for what the cost may be with help from insurance (it may be cheaper with your insurance), but the website can give you the price if you did not use any insurance.  - You can print the associated coupon and take  it with your prescription to the pharmacy.  - You may also stop by our office during regular business hours and pick up a GoodRx coupon card.  - If you need your prescription sent electronically to a different pharmacy, notify our office through [facility] or by phone at [phone] option 4.     Si Usted Necesita Algo Despus de Su Visita  Tambin puede enviarnos un mensaje a travs de Pharmacist, community. Por lo general respondemos a los mensajes de MyChart en el transcurso de 1 a 2 das hbiles.  Para renovar recetas, por favor pida a su farmacia que se ponga en contacto con nuestra oficina. Harland Dingwall de fax es [place] [phone].  Si tiene un asunto urgente cuando la clnica est cerrada y que no puede esperar hasta el siguiente da hbil, puede llamar/localizar a su doctor(a) al nmero que aparece a continuacin.   Por favor, tenga en cuenta que aunque hacemos todo lo posible para estar disponibles para asuntos urgentes fuera del horario de [place], no estamos disponibles las 24 horas del da, los 7 das de la [place].   Si tiene un problema urgente y no puede comunicarse con nosotros, puede optar por buscar atencin mdica  en el consultorio de su doctor(a), en una clnica privada, en un centro de atencin urgente o en una sala de emergencias.  Si tiene Engineering geologist, por favor llame inmediatamente al 911 o vaya a la sala de emergencias.  Nmeros de bper  - Dr. Nehemiah Massed: [phone]  - Dra. Moye: [phone]  - Dra. Nicole Kindred: [phone]  En caso de inclemencias del [place], por favor llame a Johnsie Kindred principal al [phone] para una actualizacin sobre el [place] de cualquier retraso o cierre.  Consejos para la medicacin en dermatologa: Por favor, guarde las cajas en las que vienen los medicamentos de uso tpico para ayudarle a seguir las instrucciones sobre dnde y cmo usarlos. Las farmacias generalmente imprimen las instrucciones del medicamento slo en las  cajas y no directamente en los tubos del [place].   Si su medicamento  medicamento es muy caro, por favor, pngase en contacto con nuestra oficina llamando al [phone] y presione la opcin 4 o envenos un mensaje a travs de MyChart.   No podemos decirle cul ser su copago por los medicamentos por adelantado ya que esto es diferente dependiendo de la cobertura de su seguro. Sin embargo, es posible que podamos encontrar un medicamento sustituto a menor costo o llenar un formulario para que el seguro cubra el medicamento que se considera necesario.   Si se requiere una autorizacin previa para que su compaa de seguros cubra su medicamento, por favor permtanos de 1 a 2 das hbiles para completar este proceso.  Los precios de los medicamentos varan con frecuencia dependiendo del lugar de dnde se surte la receta y alguna farmacias pueden ofrecer precios ms baratos.  El sitio web www.goodrx.com tiene cupones para medicamentos de diferentes farmacias. Los precios aqu no tienen en cuenta lo que podra costar con la ayuda del seguro (puede ser ms barato con su seguro), pero el sitio web puede darle el precio si no utiliz ningn seguro.  - Puede imprimir el cupn correspondiente y llevarlo con su receta a la farmacia.  - Tambin puede pasar por nuestra oficina durante el horario de atencin regular y recoger una tarjeta de cupones de GoodRx.  - Si necesita que su receta se enve electrnicamente a una farmacia diferente, informe a nuestra oficina a travs de MyChart de [place] o por telfono llamando al [phone] y presione la opcin 4.  

## 2021-11-23 NOTE — Progress Notes (Signed)
Follow-Up Visit   Subjective  Lisa Simmons is a 61 y.o. female who presents for the following: Annual Exam (1 year tbse. Rash forearms only after getting in pool silicone band and always gets on 3rd day of wearing.  Moles at right lower abdomen scaling and itchy ).  The patient presents for Total-Body Skin Exam (TBSE) for skin cancer screening and mole check.  The patient has spots, moles and lesions to be evaluated, some may be new or changing and the patient has concerns that these could be cancer.  The following portions of the chart were reviewed this encounter and updated as appropriate:  Tobacco  Allergies  Meds  Problems  Med Hx  Surg Hx  Fam Hx      Review of Systems: No other skin or systemic complaints except as noted in HPI or Assessment and Plan.   Objective  Well appearing patient in no apparent distress; mood and affect are within normal limits.  A full examination was performed including scalp, head, eyes, ears, nose, lips, neck, chest, axillae, abdomen, back, buttocks, bilateral upper extremities, bilateral lower extremities, hands, feet, fingers, toes, fingernails, and toenails. All findings within normal limits unless otherwise noted below.  Right Forearm - Anterior Scaly pink plaque  Head - Anterior (Face) Mid-face erythema  Right Flank Erythematous stuck-on, waxy papule or plaque   Assessment & Plan  Contact dermatitis, unspecified contact dermatitis type, unspecified trigger Right Forearm - Anterior  Hx of rash after wearing silicone band at pool  Chronic and persistent condition with duration or expected duration over one year. Condition is bothersome/symptomatic for patient. Currently flared.  Can use white skin tape purchase over the counter apply as a wrap around band to prevent touching skin Also offered doctor's note for her not to have to wear it.  Tmc cream 0.1 % apply topically to aa of right forearm , right wrist , can also use  for bug bites twice daily for up to 2 weeks. Avoid applying to face, groin, and axilla. Use as directed. Long-term use can cause thinning of the skin.   Topical steroids (such as triamcinolone, fluocinolone, fluocinonide, mometasone, clobetasol, halobetasol, betamethasone, hydrocortisone) can cause thinning and lightening of the skin if they are used for too long in the same area. Your physician has selected the right strength medicine for your problem and area affected on the body. Please use your medication only as directed by your physician to prevent side effects.   Discussed could consider patch testing in future  triamcinolone cream (KENALOG) 0.1 % - Right Forearm - Anterior Apply 1 Application topically 2 (two) times daily as needed (Rash). Apply to aa at right forearm / wrist / can also use for bug bites. Use for up to 2 weeks. Avoid applying to face, groin, and axilla. Use as directed.  Rosacea Head - Anterior (Face)  Rosacea is a chronic progressive skin condition usually affecting the face of adults, causing redness and/or acne bumps. It is treatable but not curable. It sometimes affects the eyes (ocular rosacea) as well. It may respond to topical and/or systemic medication and can flare with stress, sun exposure, alcohol, exercise and some foods.  Daily application of broad spectrum spf 30+ sunscreen to face is recommended to reduce flares.  Patient deferred treatment at this time.     Inflamed seborrheic keratosis Right Flank  - Discussed benign etiology and prognosis. - Observe - Call for any changes  Patient deferred treatment at this  time.   Lentigines - Scattered tan macules - Due to sun exposure - Benign-appearing, observe - Recommend daily broad spectrum sunscreen SPF 30+ to sun-exposed areas, reapply every 2 hours as needed. - Call for any changes  Seborrheic Keratoses At right upper abdomen  - Stuck-on, waxy, tan-brown papules and/or plaques  -  Benign-appearing - Discussed benign etiology and prognosis. - Observe - Call for any changes  Melanocytic Nevi - Tan-brown and/or pink-flesh-colored symmetric macules and papules - Benign appearing on exam today - Observation - Call clinic for new or changing moles - Recommend daily use of broad spectrum spf 30+ sunscreen to sun-exposed areas.   Hemangiomas - Red papules - Discussed benign nature - Observe - Call for any changes  Actinic Damage - Chronic condition, secondary to cumulative UV/sun exposure - diffuse scaly erythematous macules with underlying dyspigmentation - Recommend daily broad spectrum sunscreen SPF 30+ to sun-exposed areas, reapply every 2 hours as needed.  - Staying in the shade or wearing long sleeves, sun glasses (UVA+UVB protection) and wide brim hats (4-inch brim around the entire circumference of the hat) are also recommended for sun protection.  - Call for new or changing lesions.  Skin cancer screening performed today. Return in about 1 year (around 11/24/2022) for TBSE. I, Ruthell Rummage, CMA, am acting as scribe for Forest Gleason, MD.  Documentation: I have reviewed the above documentation for accuracy and completeness, and I agree with the above.  Forest Gleason, MD

## 2021-12-04 ENCOUNTER — Encounter: Payer: Self-pay | Admitting: Dermatology

## 2022-01-19 ENCOUNTER — Other Ambulatory Visit: Payer: Self-pay | Admitting: Family Medicine

## 2022-02-24 ENCOUNTER — Ambulatory Visit (INDEPENDENT_AMBULATORY_CARE_PROVIDER_SITE_OTHER): Payer: BC Managed Care – PPO | Admitting: Family Medicine

## 2022-02-24 ENCOUNTER — Encounter: Payer: Self-pay | Admitting: Family Medicine

## 2022-02-24 ENCOUNTER — Other Ambulatory Visit (HOSPITAL_COMMUNITY)
Admission: RE | Admit: 2022-02-24 | Discharge: 2022-02-24 | Disposition: A | Discharge status: Home / Self Care | Payer: BC Managed Care – PPO | Source: Ambulatory Visit | Attending: Family Medicine | Admitting: Family Medicine

## 2022-02-24 VITALS — BP 128/80 | HR 77 | Temp 98.2°F | Ht 63.0 in | Wt 160.0 lb

## 2022-02-24 DIAGNOSIS — E559 Vitamin D deficiency, unspecified: Secondary | ICD-10-CM | POA: Diagnosis not present

## 2022-02-24 DIAGNOSIS — Z1159 Encounter for screening for other viral diseases: Secondary | ICD-10-CM | POA: Diagnosis not present

## 2022-02-24 DIAGNOSIS — Z114 Encounter for screening for human immunodeficiency virus [HIV]: Secondary | ICD-10-CM

## 2022-02-24 DIAGNOSIS — Z124 Encounter for screening for malignant neoplasm of cervix: Secondary | ICD-10-CM

## 2022-02-24 DIAGNOSIS — Z0000 Encounter for general adult medical examination without abnormal findings: Secondary | ICD-10-CM

## 2022-02-24 DIAGNOSIS — Z1231 Encounter for screening mammogram for malignant neoplasm of breast: Secondary | ICD-10-CM | POA: Insufficient documentation

## 2022-02-24 DIAGNOSIS — M8580 Other specified disorders of bone density and structure, unspecified site: Secondary | ICD-10-CM

## 2022-02-24 NOTE — Progress Notes (Signed)
I,Connie R Striblin,acting as a Education administrator for Gwyneth Sprout, FNP.,have documented all relevant documentation on the behalf of Gwyneth Sprout, FNP,as directed by  Gwyneth Sprout, FNP while in the presence of Gwyneth Sprout, FNP.   Complete physical exam   Patient: Lisa Simmons   DOB: 02/05/1961   61 y.o. Female  MRN: [id number] Visit Date: 02/24/2022  Today's healthcare provider: Gwyneth Sprout, FNP  Re Introduced to nurse practitioner role and practice setting.  All questions answered.  Discussed provider/patient relationship and expectations.  Chief Complaint  Patient presents with   Annual Exam   Subjective    Lisa Simmons is a 61 y.o. female who presents today for a complete physical exam.  She reports consuming a general diet. The patient has a physically strenuous job, but has no regular exercise apart from work.  She generally feels fairly well. She reports sleeping well. She does  have additional problems to discuss today.  HPI  Pt complains of lower Right back pain X3 months. Pt states that she had osteoporosis, and she believes that it could be getting worse.  Past Medical History:  Diagnosis Date   Anemia    History of chicken pox    History of measles    History of mumps    Insomnia    Seborrheic keratoses    Thyroid disease    Vulvar atrophy    Past Surgical History:  Procedure Laterality Date   CESAREAN SECTION  1989   DILATION AND CURETTAGE OF UTERUS  1983   x2   SUPRACERVICAL ABDOMINAL HYSTERECTOMY  2003   due to Endometriosis and severe Anemia, Laproscopic; still has ovaries and cervix   THYROIDECTOMY  06/2012   Dr. Jerral Bonito; due to multinodular goiter   TONSILLECTOMY  1970   Social History   Socioeconomic History   Marital status: Married    Spouse name: Not on file   Number of children: 3   Years of education: Not on file   Highest education level: Not on file  Occupational History   Occupation: Teacher  Tobacco Use   Smoking status: Former     Packs/day: 1.00    Years: 7.00    Total pack years: 7.00    Types: Cigarettes    Quit date: 03/20/1986    Years since quitting: 35.9   Smokeless tobacco: Never  Vaping Use   Vaping Use: Never used  Substance and Sexual Activity   Alcohol use: Yes    Alcohol/week: 0.0 standard drinks of alcohol    Comment: 1-2 drinks per year   Drug use: No   Sexual activity: Not on file  Other Topics Concern   Not on file  Social History Narrative   Not on file   Social Determinants of Health   Financial Resource Strain: Not on file  Food Insecurity: Not on file  Transportation Needs: Not on file  Physical Activity: Not on file  Stress: Not on file  Social Connections: Not on file  Intimate Partner Violence: Not on file   Family Status  Relation Name Status   Mother  Deceased   Father  Deceased       Cause of Death: overdose; substance abuse   Sister half sister Deceased       substance abuse   Sister  Comptroller   Sister  Alive   Mat Aunt  Alive   Pat Uncle  Deceased   MGM  (Not Specified)  MGF  Deceased   PGM  Deceased   PGF  Deceased   Neg Hx  (Not Specified)   Family History  Problem Relation Age of Onset   Hypothyroidism Mother    Diabetes Mother    Cirrhosis Mother    Heart disease Father    Depression Father    Cirrhosis Sister    Alcohol abuse Sister    Leukemia Maternal Aunt    Bladder Cancer Paternal Uncle    Heart disease Maternal Grandmother    Heart disease Maternal Grandfather    Stroke Maternal Grandfather    Heart disease Paternal Grandmother    Heart disease Paternal Grandfather    Breast cancer Neg Hx    Allergies  Allergen Reactions   Avelox  [Moxifloxacin Hcl In Nacl]     Other reaction(s): Rapid pulse   Iodine     Other reaction(s): Hives   Penicillin G Rash    Patient Care Team: Gwyneth Sprout, FNP as PCP - General (Family Medicine) Margaretha Sheffield, MD (Otolaryngology)   Medications: Outpatient Medications Prior to Visit  Medication Sig    ALPRAZolam (XANAX) 0.25 MG tablet TAKE 1 TABLET BY MOUTH AT BEDTIME AS NEEDED FOR ANXIETY.   diphenoxylate-atropine (LOMOTIL) 2.5-0.025 MG tablet TAKE 1-2 TABLETS BY MOUTH 4 (FOUR) TIMES DAILY AS NEEDED FOR DIARRHEA OR LOOSE STOOLS.   estradiol (ESTRACE) 1 MG tablet Take 1 tablet (1 mg total) by mouth daily.   levothyroxine (SYNTHROID) 88 MCG tablet Take 88 mcg by mouth daily.   omeprazole (PRILOSEC) 40 MG capsule TAKE 1 CAPSULE BY MOUTH EVERY DAY (Patient taking differently: PRN)   sertraline (ZOLOFT) 50 MG tablet TAKE 1 TABLET BY MOUTH EVERY DAY   Vitamin D, Ergocalciferol, (DRISDOL) 1.25 MG (50000 UNIT) CAPS capsule Take 1 capsule (50,000 Units total) by mouth every 7 (seven) days.   triamcinolone cream (KENALOG) 0.1 % Apply 1 Application topically 2 (two) times daily as needed (Rash). Apply to aa at right forearm / wrist / can also use for bug bites. Use for up to 2 weeks. Avoid applying to face, groin, and axilla. Use as directed. (Patient not taking: Reported on 02/24/2022)   No facility-administered medications prior to visit.    Review of Systems  HENT:  Positive for rhinorrhea.   Gastrointestinal:  Positive for constipation and diarrhea.  Endocrine: Positive for cold intolerance and heat intolerance.  Genitourinary:  Positive for urgency.  Musculoskeletal:  Positive for back pain.  Psychiatric/Behavioral:  Positive for decreased concentration.     Objective    BP 128/80 (BP Location: Right Arm, Patient Position: Sitting, Cuff Size: Normal)   Pulse 77   Temp 98.2 F (36.8 C) (Oral)   Ht '5\' 3"'$  (1.6 m)   Wt 160 lb (72.6 kg) Comment: pt stated following visit 180 lbs incorrect  LMP 07/14/2001   SpO2 98%   BMI 28.34 kg/m   Physical Exam Vitals and nursing note reviewed.  Constitutional:      General: She is awake. She is not in acute distress.    Appearance: Normal appearance. She is well-developed and well-groomed. She is obese. She is not ill-appearing, toxic-appearing or  diaphoretic.  HENT:     Head: Normocephalic and atraumatic.     Jaw: There is normal jaw occlusion. No trismus, tenderness, swelling or pain on movement.     Right Ear: Hearing, tympanic membrane, ear canal and external ear normal. There is no impacted cerumen.     Left Ear: Hearing, tympanic membrane,  ear canal and external ear normal. There is no impacted cerumen.     Nose: Nose normal. No congestion or rhinorrhea.     Right Turbinates: Not enlarged, swollen or pale.     Left Turbinates: Not enlarged, swollen or pale.     Right Sinus: No maxillary sinus tenderness or frontal sinus tenderness.     Left Sinus: No maxillary sinus tenderness or frontal sinus tenderness.     Mouth/Throat:     Lips: Pink.     Mouth: Mucous membranes are moist. No injury.     Tongue: No lesions.     Pharynx: Oropharynx is clear. Uvula midline. No pharyngeal swelling, oropharyngeal exudate, posterior oropharyngeal erythema or uvula swelling.     Tonsils: No tonsillar exudate or tonsillar abscesses.  Eyes:     General: Lids are normal. Lids are everted, no foreign bodies appreciated. Vision grossly intact. Gaze aligned appropriately. No allergic shiner or visual field deficit.       Right eye: No discharge.        Left eye: No discharge.     Extraocular Movements: Extraocular movements intact.     Conjunctiva/sclera: Conjunctivae normal.     Right eye: Right conjunctiva is not injected. No exudate.    Left eye: Left conjunctiva is not injected. No exudate.    Pupils: Pupils are equal, round, and reactive to light.  Neck:     Thyroid: No thyroid mass, thyromegaly or thyroid tenderness.     Vascular: No carotid bruit.     Trachea: Trachea normal.  Cardiovascular:     Rate and Rhythm: Normal rate and regular rhythm.     Pulses: Normal pulses.          Carotid pulses are 2+ on the right side and 2+ on the left side.      Radial pulses are 2+ on the right side and 2+ on the left side.       Dorsalis pedis  pulses are 2+ on the right side and 2+ on the left side.       Posterior tibial pulses are 2+ on the right side and 2+ on the left side.     Heart sounds: Normal heart sounds, S1 normal and S2 normal. No murmur heard.    No friction rub. No gallop.  Pulmonary:     Effort: Pulmonary effort is normal. No respiratory distress.     Breath sounds: Normal breath sounds and air entry. No stridor. No wheezing, rhonchi or rales.  Chest:     Chest wall: No tenderness.     Comments: Breasts: breasts appear normal, no suspicious masses, no skin or nipple changes or axillary nodes, symmetric fibrous changes in both upper outer quadrants, unchanged from previous exams, risk and benefit of breast self-exam was discussed  Abdominal:     General: Abdomen is flat. Bowel sounds are normal. There is no distension.     Palpations: Abdomen is soft. There is no mass.     Tenderness: There is no abdominal tenderness. There is no right CVA tenderness, left CVA tenderness, guarding or rebound.     Hernia: No hernia is present. There is no hernia in the right inguinal area.  Genitourinary:    General: Normal vulva.     Exam position: Lithotomy position.     Pubic Area: No rash or pubic lice.      Tanner stage (genital): 5.     Labia:        Right: No  rash, tenderness, lesion or injury.        Left: No rash, tenderness, lesion or injury.      Urethra: No prolapse, urethral pain, urethral swelling or urethral lesion.     Vagina: Normal.     Cervix: Normal.     Uterus: Absent.      Adnexa: Right adnexa normal.     Rectum: Normal.     Comments: Declines assistance of chaperone  Musculoskeletal:        General: No swelling, tenderness, deformity or signs of injury. Normal range of motion.     Cervical back: Full passive range of motion without pain, normal range of motion and neck supple. No edema, rigidity or tenderness. No muscular tenderness.     Right lower leg: No edema.     Left lower leg: No edema.   Lymphadenopathy:     Cervical: No cervical adenopathy.     Right cervical: No superficial, deep or posterior cervical adenopathy.    Left cervical: No superficial, deep or posterior cervical adenopathy.     Lower Body: No right inguinal adenopathy.  Skin:    General: Skin is warm and dry.     Capillary Refill: Capillary refill takes less than 2 seconds.     Coloration: Skin is not jaundiced or pale.     Findings: No bruising, erythema, lesion or rash.  Neurological:     General: No focal deficit present.     Mental Status: She is alert and oriented to person, place, and time. Mental status is at baseline.     GCS: GCS eye subscore is 4. GCS verbal subscore is 5. GCS motor subscore is 6.     Sensory: Sensation is intact. No sensory deficit.     Motor: Motor function is intact. No weakness.     Coordination: Coordination is intact. Coordination normal.     Gait: Gait is intact. Gait normal.  Psychiatric:        Attention and Perception: Attention and perception normal.        Mood and Affect: Mood and affect normal.        Speech: Speech normal.        Behavior: Behavior normal. Behavior is cooperative.        Thought Content: Thought content normal.        Cognition and Memory: Cognition and memory normal.        Judgment: Judgment normal.     Last depression screening scores    02/24/2022    9:42 AM 03/02/2021    2:40 PM 03/26/2020    8:48 AM  PHQ 2/9 Scores  PHQ - 2 Score 0 2 0  PHQ- 9 Score '3 11 6   '$ Last fall risk screening    02/24/2022    9:42 AM  [place] in the past year? 0  Number falls in past yr: 0  Injury with Fall? 0   Last Audit-C alcohol use screening    02/24/2022    9:42 AM  Alcohol Use Disorder Test (AUDIT)  1. How often do you have a drink containing alcohol? 1  2. How many drinks containing alcohol do you have on a typical day when you are drinking? 0  3. How often do you have six or more drinks on one occasion? 0  AUDIT-C Score 1   A  score of 3 or more in women, and 4 or more in men indicates increased risk for alcohol abuse,  EXCEPT if all of the points are from question 1   No results found for any visits on 02/24/22.  Assessment & Plan    Routine Health Maintenance and Physical Exam  Exercise Activities and Dietary recommendations  Goals   None     Immunization History  Administered Date(s) Administered   Influenza,inj,Quad PF,6+ Mos 03/11/2019   Influenza-Unspecified 02/02/2015, 01/01/2017, 01/31/2018   Tdap 03/10/2013    Health Maintenance  Topic Date Due   Hepatitis C Screening  Never done   PAP SMEAR-Modifier  03/15/2022   COVID-19 Vaccine (1) 03/12/2022 (Originally 06/19/1965)   Zoster Vaccines- Shingrix (1 of 2) 05/26/2022 (Originally 06/18/1981)   INFLUENZA VACCINE  06/18/2022 (Originally 10/18/2021)   DTaP/Tdap/Td (2 - Td or Tdap) 03/11/2023   MAMMOGRAM  03/23/2023   Fecal DNA (Cologuard)  05/10/2024   HIV Screening  Completed   HPV VACCINES  Aged Out    Discussed health benefits of physical activity, and encouraged her to engage in regular exercise appropriate for her age and condition.  Problem List Items Addressed This Visit       Musculoskeletal and Integument   Osteopenia    Chronic, stable Concern things may be worsening Not on any bisphosphatase to assist Repeat DEXA      Relevant Orders   DG Bone Density     Other   Annual physical exam - Primary    UTD on vision Due for dental Has taken in a 57 week old foster baby; reports this has added some stressors and difficulty sleeping Due for PAP, mammo and labs Things to do to keep yourself healthy  - Exercise at least 30-45 minutes a day, 3-4 days a week.  - Eat a low-fat diet with lots of fruits and vegetables, up to 7-9 servings per day.  - Seatbelts can save your life. Wear them always.  - Smoke detectors on every level of your home, check batteries every year.  - Eye Doctor - have an eye exam every 1-2 years  - Safe sex - if  you may be exposed to STDs, use a condom.  - Alcohol -  If you drink, do it moderately, less than 2 drinks per day.  - [place]. Choose someone to speak for you if you are not able.  - Depression is common in our stressful world.If you're feeling down or losing interest in things you normally enjoy, please come in for a visit.  - Violence - If anyone is threatening or hurting you, please call immediately.       Relevant Orders   Comprehensive Metabolic Panel (CMET)   CBC   Lipid panel   Avitaminosis D    Chronic, previously stable Pt request to repeat       Relevant Orders   Vitamin D (25 hydroxy)   Encounter for hepatitis C screening test for low risk patient   Relevant Orders   Hepatitis C Antibody   Encounter for screening for HIV   Relevant Orders   HIV antibody (with reflex)   Encounter for screening mammogram for malignant neoplasm of breast   Relevant Orders   MM 3D SCREEN BREAST BILATERAL   Pap smear for cervical cancer screening   Relevant Orders   Cytology - PAP   Return in about 1 year (around 02/25/2023).    Vonna Kotyk, FNP, have reviewed all documentation for this visit. The documentation on 02/24/22 for the exam, diagnosis, procedures, and orders are all accurate  and complete.  Gwyneth Sprout, [place] [phone] (phone) [phone] (fax)  [place]

## 2022-02-24 NOTE — Patient Instructions (Signed)
Please call and schedule your mammogram:  [facility]  [address], [address] [facility] [place],  [place]  [address] Get Driving Directions Main: [phone]  Sunday:Closed Monday:7:20 AM - 5:00 PM Tuesday:7:20 AM - 5:00 PM Wednesday:7:20 AM - 5:00 PM Thursday:7:20 AM - 5:00 PM Friday:7:20 AM - 4:30 PM Saturday:Closed  

## 2022-02-24 NOTE — Assessment & Plan Note (Signed)
Chronic, stable Concern things may be worsening Not on any bisphosphatase to assist Repeat DEXA

## 2022-02-24 NOTE — Assessment & Plan Note (Signed)
Chronic, previously stable Pt request to repeat

## 2022-02-24 NOTE — Assessment & Plan Note (Signed)
UTD on vision Due for dental Has taken in a 71 week old foster baby; reports this has added some stressors and difficulty sleeping Due for PAP, mammo and labs Things to do to keep yourself healthy  - Exercise at least 30-45 minutes a day, 3-4 days a week.  - Eat a low-fat diet with lots of fruits and vegetables, up to 7-9 servings per day.  - Seatbelts can save your life. Wear them always.  - Smoke detectors on every level of your home, check batteries every year.  - Eye Doctor - have an eye exam every 1-2 years  - Safe sex - if you may be exposed to STDs, use a condom.  - Alcohol -  If you drink, do it moderately, less than 2 drinks per day.  - [place]. Choose someone to speak for you if you are not able.  - Depression is common in our stressful world.If you're feeling down or losing interest in things you normally enjoy, please come in for a visit.  - Violence - If anyone is threatening or hurting you, please call immediately.

## 2022-02-25 LAB — LIPID PANEL
Chol/HDL Ratio: 3.2 ratio (ref 0.0–4.4)
Cholesterol, Total: 233 mg/dL — ABNORMAL HIGH (ref 100–199)
HDL: 73 mg/dL (ref >39)
LDL Chol Calc (NIH): 136 mg/dL — ABNORMAL HIGH (ref 0–99)
Triglycerides: 138 mg/dL (ref 0–149)
VLDL Cholesterol Cal: 24 mg/dL (ref 5–40)

## 2022-02-25 LAB — COMPREHENSIVE METABOLIC PANEL
ALT: 12 IU/L (ref 0–32)
AST: 17 IU/L (ref 0–40)
Albumin/Globulin Ratio: 2 (ref 1.2–2.2)
Albumin: 4.6 g/dL (ref 3.8–4.9)
Alkaline Phosphatase: 66 IU/L (ref 44–121)
BUN/Creatinine Ratio: 10 (ref 9–23)
BUN: 8 mg/dL (ref 6–24)
Bilirubin Total: 0.4 mg/dL (ref 0.0–1.2)
CO2: 23 mmol/L (ref 20–29)
Calcium: 9.4 mg/dL (ref 8.7–10.2)
Chloride: 102 mmol/L (ref 96–106)
Creatinine, Ser: 0.81 mg/dL (ref 0.57–1.00)
Globulin, Total: 2.3 g/dL (ref 1.5–4.5)
Glucose: 97 mg/dL (ref 70–99)
Potassium: 3.9 mmol/L (ref 3.5–5.2)
Sodium: 141 mmol/L (ref 134–144)
Total Protein: 6.9 g/dL (ref 6.0–8.5)
eGFR: 84 mL/min/{1.73_m2} (ref >59)

## 2022-02-25 LAB — CBC
Hematocrit: 38 % (ref 34.0–46.6)
Hemoglobin: 13.1 g/dL (ref 11.1–15.9)
MCH: 32.2 pg (ref 26.6–33.0)
MCHC: 34.5 g/dL (ref 31.5–35.7)
MCV: 93 fL (ref 79–97)
Platelets: 233 10*3/uL (ref 150–450)
RBC: 4.07 x10E6/uL (ref 3.77–5.28)
RDW: 11.9 % (ref 11.7–15.4)
WBC: 6.9 10*3/uL (ref 3.4–10.8)

## 2022-02-25 LAB — HIV ANTIBODY (ROUTINE TESTING W REFLEX): HIV Screen 4th Generation wRfx: NONREACTIVE

## 2022-02-25 LAB — VITAMIN D 25 HYDROXY (VIT D DEFICIENCY, FRACTURES): Vit D, 25-Hydroxy: 40.3 ng/mL (ref 30.0–100.0)

## 2022-02-25 LAB — HEPATITIS C ANTIBODY: Hep C Virus Ab: NONREACTIVE

## 2022-02-26 ENCOUNTER — Other Ambulatory Visit: Payer: Self-pay | Admitting: Family Medicine

## 2022-02-26 DIAGNOSIS — N905 Atrophy of vulva: Secondary | ICD-10-CM

## 2022-02-26 MED ORDER — ESTRADIOL 1 MG PO TABS: 1.0000 mg | ORAL_TABLET | Freq: Every day | ORAL | 3 refills | Status: DC

## 2022-02-26 MED ORDER — SERTRALINE HCL 50 MG PO TABS
50.0000 mg | ORAL_TABLET | Freq: Every day | ORAL | 3 refills | Status: DC
Start: 2022-02-26 — End: 2023-03-15

## 2022-02-26 NOTE — Progress Notes (Signed)
Cholesterol shows elevated total and improved HDL and improved LDL. The 10-year ASCVD risk score (Arnett DK, et al., 2019) is: 2.7%   Values used to calculate the score:     Age: 61 years     Sex: Female     Is Non-Hispanic African American: No     Diabetic: No     Tobacco smoker: No     Systolic Blood Pressure: 093 mmHg     Is BP treated: No     HDL Cholesterol: 73 mg/dL     Total Cholesterol: 233 mg/dL

## 2022-02-28 LAB — CYTOLOGY - PAP
Comment: NEGATIVE
Diagnosis: NEGATIVE
High risk HPV: NEGATIVE

## 2022-02-28 NOTE — Progress Notes (Signed)
Normal PAP; repeat in 5 years.

## 2022-03-29 ENCOUNTER — Ambulatory Visit
Admission: RE | Admit: 2022-03-29 | Discharge: 2022-03-29 | Disposition: A | Discharge status: Home / Self Care | Payer: BC Managed Care – PPO | Source: Ambulatory Visit | Attending: Family Medicine | Admitting: Family Medicine

## 2022-03-29 DIAGNOSIS — Z1231 Encounter for screening mammogram for malignant neoplasm of breast: Secondary | ICD-10-CM | POA: Insufficient documentation

## 2022-03-29 DIAGNOSIS — M8580 Other specified disorders of bone density and structure, unspecified site: Secondary | ICD-10-CM

## 2022-03-29 NOTE — Progress Notes (Signed)
Osteopenia, decreased bone strength noted. Can use Vit D 800 IU and Calcium 1200 mg to assist regular exercise for bone health. Can repeat DEXA in 3 years if desired.  Please let us know if you have any questions.  Thank you,  Tally Joe, FNP

## 2022-03-30 NOTE — Progress Notes (Signed)
Hi Lisa Simmons  Normal mammogram; repeat in 1 year.  Please let us know if you have any questions.  Thank you,  Tally Joe, FNP

## 2022-04-06 ENCOUNTER — Encounter: Payer: Self-pay | Admitting: Physician Assistant

## 2022-04-06 ENCOUNTER — Ambulatory Visit: Payer: BC Managed Care – PPO | Admitting: Physician Assistant

## 2022-04-06 ENCOUNTER — Encounter: Payer: Self-pay | Admitting: Family Medicine

## 2022-04-06 VITALS — BP 121/70 | HR 84 | Temp 98.2°F | Ht 63.0 in | Wt 157.0 lb

## 2022-04-06 DIAGNOSIS — J329 Chronic sinusitis, unspecified: Secondary | ICD-10-CM

## 2022-04-06 NOTE — Progress Notes (Signed)
Date:  04/06/2022   Name:  Lisa Simmons   DOB:  16-Jan-1961   MRN:  [id number]   Chief Complaint: "Symptoms of Sinusitis"  Sinusitis This is a new problem. Episode onset: X6 days. The problem has been gradually worsening since onset. Maximum temperature: 102. The fever has been present for 1 to 2 days. Her pain is at a severity of 6/10. The pain is mild. Associated symptoms include chills, congestion, coughing, headaches, shortness of breath, sinus pressure and a sore throat. Treatments tried: motrin, nyquil. The treatment provided no relief.   Pt was diagnosed with an eye infection recently and still using abx eye drops. Her husband had an eye infection as well.  Lab Results  Component Value Date   NA 141 02/24/2022   K 3.9 02/24/2022   CO2 23 02/24/2022   GLUCOSE 97 02/24/2022   BUN 8 02/24/2022   CREATININE 0.81 02/24/2022   CALCIUM 9.4 02/24/2022   EGFR 84 02/24/2022   GFRNONAA 80 03/26/2020   Lab Results  Component Value Date   CHOL 233 (H) 02/24/2022   HDL 73 02/24/2022   LDLCALC 136 (H) 02/24/2022   TRIG 138 02/24/2022   CHOLHDL 3.2 02/24/2022   Lab Results  Component Value Date   TSH 0.653 03/04/2021   Lab Results  Component Value Date   HGBA1C 5.2 03/04/2021   Lab Results  Component Value Date   WBC 6.9 02/24/2022   HGB 13.1 02/24/2022   HCT 38.0 02/24/2022   MCV 93 02/24/2022   PLT 233 02/24/2022   Lab Results  Component Value Date   ALT 12 02/24/2022   AST 17 02/24/2022   ALKPHOS 66 02/24/2022   BILITOT 0.4 02/24/2022   Lab Results  Component Value Date   VD25OH 40.3 02/24/2022     Review of Systems  Constitutional:  Positive for chills.  HENT:  Positive for congestion, sinus pressure and sore throat.   Respiratory:  Positive for cough and shortness of breath.   Neurological:  Positive for headaches.    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   Diverticulitis 05/18/2017   Irritable bowel syndrome 05/07/2015   History of allergy 03/11/2015   Anxiety 03/11/2015   Arthritis 03/11/2015   Hypothyroidism, 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 Jerral Bonito; due to multinodular goiter   TONSILLECTOMY  1970  Social History   Tobacco Use   Smoking status: Former    Packs/day: 1.00    Years: 7.00    Total pack years: 7.00    Types: Cigarettes    Quit date: 03/20/1986    Years since quitting: 36.0   Smokeless tobacco: Never  Vaping Use   Vaping Use: Never used  Substance Use Topics   Alcohol use: Yes    Alcohol/week: 0.0 standard drinks of alcohol    Comment: 1-2 drinks per year   Drug use: No     Medication list has been reviewed and updated.  No outpatient medications have been marked as taking for the 04/06/22 encounter (Office Visit) with Mardene Speak, PA-C.        No data to display             02/24/2022    9:42 AM 03/02/2021    2:40 PM 03/26/2020    8:48 AM   Depression screen PHQ 2/9  Decreased Interest 0 2 0  Down, Depressed, Hopeless 0 0 0  PHQ - 2 Score 0 2 0  Altered sleeping 0 3 1  Tired, decreased energy '1 3 2  '$ Change in appetite 0 0 0  Feeling bad or failure about yourself  0 0 0  Trouble concentrating '2 3 3  '$ Moving slowly or fidgety/restless 0 0 0  Suicidal thoughts 0 0 0  PHQ-9 Score '3 11 6  '$ Difficult doing work/chores Not difficult at all Somewhat difficult Somewhat difficult    BP Readings from Last 3 Encounters:  02/24/22 128/80  03/22/21 (!) 155/94  03/02/21 134/82    Physical Exam Vitals reviewed.  Constitutional:      General: She is not in acute distress.    Appearance: Normal appearance. She is well-developed. She is not diaphoretic.  HENT:     Head: Normocephalic and atraumatic.     Right Ear: Ear canal and external ear normal.     Left Ear: Ear canal and external ear normal.     Nose: Congestion and rhinorrhea present.     Right Sinus: Maxillary sinus tenderness (right ethmoid/maxillary) present.     Mouth/Throat:     Pharynx: Posterior oropharyngeal erythema (mild) present.  Eyes:     General: No scleral icterus.       Right eye: No discharge.        Left eye: No discharge.     Conjunctiva/sclera: Conjunctivae normal.     Comments: Slightly injected conjunctiva bilateral  Neck:     Thyroid: No thyromegaly.  Cardiovascular:     Rate and Rhythm: Normal rate and regular rhythm.     Pulses: Normal pulses.     Heart sounds: Normal heart sounds. No murmur heard. Pulmonary:     Effort: Pulmonary effort is normal. No respiratory distress.     Breath sounds: Normal breath sounds. No wheezing, rhonchi or rales.  Musculoskeletal:     Cervical back: Neck supple.     Right lower leg: No edema.     Left lower leg: No edema.  Lymphadenopathy:     Cervical: No cervical adenopathy.  Skin:    General: Skin is warm and dry.     Findings: No rash.  Neurological:     Mental Status: She is alert and oriented to  person, place, and time. Mental status is at baseline.  Psychiatric:        Behavior: Behavior normal.        Thought Content: Thought content  normal.        Judgment: Judgment normal.     Wt Readings from Last 3 Encounters:  02/24/22 160 lb (72.6 kg)  03/22/21 159 lb 14.4 oz (72.5 kg)  03/02/21 158 lb 3.2 oz (71.8 kg)    Ht '5\' 3"'$  (1.6 m)   LMP 07/14/2001   BMI 28.34 kg/m   Assessment and Plan:  Sinusitis -  symptoms and exam c/w sinusitis   - given duration of symptoms, suspect viral etiology - will hold antibiotics. However, I will place an order for Zpack and send to pharmacy due to hx o "f bacterial eye infection." Some studies showed that bacterial eye drops could improve sinus infection. and prescribing antibiotics and instructing patients not to fill it unless their symptoms worsen or do not improve after several days can help to reduce antibiotic use  - discussed the potential negative side effects of antibiotics and that they can lead to resistance if used unnecessarily; discussed expectations for duration of symptoms. Discussed that they may feel bad for up to a week, but sometimes symptoms last longer. - explained  treatment options that will help with symptoms : symptomatic management (flonase, decongestants, etc) - discussed concerning symptoms like high fever, SOB, facial pain. The patient was advised to call back or seek an in-person evaluation if the symptoms worsen or if the condition fails to improve as anticipated.   The patient was advised to call back or seek an in-person evaluation if the symptoms worsen or if the condition fails to improve as anticipated.  I discussed the assessment and treatment plan with the patient. The patient was provided an opportunity to ask questions and all were answered. The patient agreed with the plan and demonstrated an understanding of the instructions.  The entirety of the information documented in the History of Present Illness,  Review of Systems and Physical Exam were personally obtained by me. Portions of this information were initially documented by the CMA and reviewed by me for thoroughness and accuracy.  Mardene Speak, [facility], [place] [phone] (phone) [phone] (fax)

## 2022-04-07 NOTE — Telephone Encounter (Signed)
Please review.  KP

## 2022-04-08 MED ORDER — AZITHROMYCIN 250 MG PO TABS: ORAL_TABLET | ORAL | 0 refills | Status: AC

## 2022-04-27 ENCOUNTER — Other Ambulatory Visit: Payer: Self-pay | Admitting: Family Medicine

## 2022-05-09 ENCOUNTER — Encounter: Payer: Self-pay | Admitting: Family Medicine

## 2022-05-09 NOTE — Progress Notes (Deleted)
      Established patient visit   Patient: Lisa Simmons   DOB: 26-Oct-1960   62 y.o. Female  MRN: [id number] Visit Date: 05/10/2022  Today's healthcare provider: Gwyneth Sprout, FNP   No chief complaint on file.  Subjective    HPI  Pain  She reports {chronicity:119221} {location:1} pain. {There was/was not:31712} an injury that may have caused the pain. The pain started {onset:119223} and is {progression:119226}. The pain {does/does not:200015} radiate {describe radiation if present:1}. The pain is described as {quality:31200}, is {severity:119268} in intensity, occurring {frequency of symptoms:119294}. {Symptoms are worse in the:16448:::1}  {Aggravating factors:16449:::1} {Relieving factors:16449:::1}.  She has tried {treatments tried:173219}{improvement:119303}.    Pain and stiffness in hips and legs, to the point of difficulty walking. Unable to sleep due to pain.  ---------------------------------------------------------------------------------------------------   Medications: Outpatient Medications Prior to Visit  Medication Sig   ALPRAZolam (XANAX) 0.25 MG tablet TAKE 1 TABLET BY MOUTH AT BEDTIME AS NEEDED FOR ANXIETY.   diphenoxylate-atropine (LOMOTIL) 2.5-0.025 MG tablet TAKE 1-2 TABLETS BY MOUTH 4 (FOUR) TIMES DAILY AS NEEDED FOR DIARRHEA OR LOOSE STOOLS.   estradiol (ESTRACE) 1 MG tablet Take 1 tablet (1 mg total) by mouth daily.   levothyroxine (SYNTHROID) 88 MCG tablet Take 88 mcg by mouth daily.   omeprazole (PRILOSEC) 40 MG capsule TAKE 1 CAPSULE BY MOUTH EVERY DAY (Patient taking differently: PRN)   sertraline (ZOLOFT) 50 MG tablet Take 1 tablet (50 mg total) by mouth daily.   triamcinolone cream (KENALOG) 0.1 % Apply 1 Application topically 2 (two) times daily as needed (Rash). Apply to aa at right forearm / wrist / can also use for bug bites. Use for up to 2 weeks. Avoid applying to face, groin, and axilla. Use as directed.   Vitamin D, Ergocalciferol, (DRISDOL)  1.25 MG (50000 UNIT) CAPS capsule Take 1 capsule (50,000 Units total) by mouth every 7 (seven) days.   No facility-administered medications prior to visit.    Review of Systems  {Labs  Heme  Chem  Endocrine  Serology  Results Review (optional):23779}   Objective    LMP 07/14/2001  {Show previous vital signs (optional):23777}  Physical Exam  ***  No results found for any visits on 05/10/22.  Assessment & Plan     ***  No follow-ups on file.      {provider attestation***:1}   Gwyneth Sprout, [place] [phone] (phone) [phone] (fax)  [place]

## 2022-05-10 ENCOUNTER — Other Ambulatory Visit: Payer: Self-pay | Admitting: Family Medicine

## 2022-05-10 ENCOUNTER — Ambulatory Visit: Payer: BC Managed Care – PPO | Admitting: Family Medicine

## 2022-05-10 MED ORDER — ONDANSETRON 4 MG PO TBDP: 4.0000 mg | ORAL_TABLET | Freq: Three times a day (TID) | ORAL | 0 refills | Status: DC | PRN

## 2022-05-15 ENCOUNTER — Ambulatory Visit (INDEPENDENT_AMBULATORY_CARE_PROVIDER_SITE_OTHER): Payer: BC Managed Care – PPO | Admitting: Family Medicine

## 2022-05-15 ENCOUNTER — Encounter: Payer: Self-pay | Admitting: Family Medicine

## 2022-05-15 VITALS — BP 137/83 | HR 66 | Temp 97.5°F | Wt 158.7 lb

## 2022-05-15 DIAGNOSIS — Z8261 Family history of arthritis: Secondary | ICD-10-CM

## 2022-05-15 DIAGNOSIS — M25552 Pain in left hip: Secondary | ICD-10-CM | POA: Diagnosis not present

## 2022-05-15 DIAGNOSIS — M25551 Pain in right hip: Secondary | ICD-10-CM | POA: Diagnosis not present

## 2022-05-15 MED ORDER — METHOCARBAMOL 750 MG PO TABS: 750.0000 mg | ORAL_TABLET | Freq: Three times a day (TID) | ORAL | 0 refills | Status: DC | PRN

## 2022-05-15 MED ORDER — MELOXICAM 15 MG PO TABS: 15.0000 mg | ORAL_TABLET | Freq: Every day | ORAL | 0 refills | Status: DC

## 2022-05-15 NOTE — Progress Notes (Signed)
I,Connie R Striblin,acting as a Education administrator for Gwyneth Sprout, FNP.,have documented all relevant documentation on the behalf of Gwyneth Sprout, FNP,as directed by  Gwyneth Sprout, FNP while in the presence of Gwyneth Sprout, FNP.  Established patient visit  Patient: Lisa Simmons   DOB: 1960-04-11   62 y.o. Female  MRN: [id number] Visit Date: 05/15/2022  Today's healthcare provider: Gwyneth Sprout, FNP  Re Introduced to nurse practitioner role and practice setting.  All questions answered.  Discussed provider/patient relationship and expectations.  Subjective    HPI  Hip Pain: Patient complains of bilaterally hip pain. Onset of the symptoms was several months ago- worsening over the last 2 weeks. Inciting event: none. Current symptoms include is worse with weight bearing. Associated symptoms: weakness in legs. Aggravating symptoms: any weight bearing. Patient's . Patient has had no prior hip problems. Previous visits for this problem: none. Evaluation to date: none.  Treatment to date: none.  Patient states that her pain causes difficulty walking at times.   Medications: Outpatient Medications Prior to Visit  Medication Sig   ALPRAZolam (XANAX) 0.25 MG tablet TAKE 1 TABLET BY MOUTH AT BEDTIME AS NEEDED FOR ANXIETY.   diphenoxylate-atropine (LOMOTIL) 2.5-0.025 MG tablet TAKE 1-2 TABLETS BY MOUTH 4 (FOUR) TIMES DAILY AS NEEDED FOR DIARRHEA OR LOOSE STOOLS.   estradiol (ESTRACE) 1 MG tablet Take 1 tablet (1 mg total) by mouth daily.   levothyroxine (SYNTHROID) 88 MCG tablet Take 88 mcg by mouth daily.   omeprazole (PRILOSEC) 40 MG capsule TAKE 1 CAPSULE BY MOUTH EVERY DAY (Patient taking differently: PRN)   ondansetron (ZOFRAN-ODT) 4 MG disintegrating tablet Take 1 tablet (4 mg total) by mouth every 8 (eight) hours as needed for nausea or vomiting (Take for a max of 5 days).   sertraline (ZOLOFT) 50 MG tablet Take 1 tablet (50 mg total) by mouth daily.   triamcinolone cream (KENALOG) 0.1 % Apply 1  Application topically 2 (two) times daily as needed (Rash). Apply to aa at right forearm / wrist / can also use for bug bites. Use for up to 2 weeks. Avoid applying to face, groin, and axilla. Use as directed.   Vitamin D, Ergocalciferol, (DRISDOL) 1.25 MG (50000 UNIT) CAPS capsule Take 1 capsule (50,000 Units total) by mouth every 7 (seven) days.   No facility-administered medications prior to visit.   Review of Systems    Objective    BP 137/83 (BP Location: Right Arm, Patient Position: Sitting, Cuff Size: Normal)   Pulse 66   Temp (!) 97.5 F (36.4 C) (Oral)   Wt 158 lb 11.2 oz (72 kg)   LMP 07/14/2001   SpO2 100%   BMI 28.11 kg/m   Physical Exam Vitals and nursing note reviewed.  Constitutional:      General: She is not in acute distress.    Appearance: Normal appearance. She is overweight. She is not ill-appearing, toxic-appearing or diaphoretic.  HENT:     Head: Normocephalic and atraumatic.  Cardiovascular:     Rate and Rhythm: Normal rate and regular rhythm.     Pulses: Normal pulses.     Heart sounds: Normal heart sounds. No murmur heard.    No friction rub. No gallop.  Pulmonary:     Effort: Pulmonary effort is normal. No respiratory distress.     Breath sounds: Normal breath sounds. No stridor. No wheezing, rhonchi or rales.  Chest:     Chest wall: No tenderness.  Abdominal:  General: Bowel sounds are normal.     Palpations: Abdomen is soft.     Tenderness: There is no abdominal tenderness. There is no right CVA tenderness, left CVA tenderness, guarding or rebound.  Musculoskeletal:        General: Tenderness present. No swelling, deformity or signs of injury. Normal range of motion.     Right lower leg: No edema.     Left lower leg: No edema.     Comments: R hip tenderness; distal point Reports slipped hip s/s   Skin:    General: Skin is warm and dry.     Capillary Refill: Capillary refill takes less than 2 seconds.     Coloration: Skin is not jaundiced  or pale.     Findings: No bruising, erythema, lesion or rash.  Neurological:     General: No focal deficit present.     Mental Status: She is alert and oriented to person, place, and time. Mental status is at baseline.     Cranial Nerves: No cranial nerve deficit.     Sensory: No sensory deficit.     Motor: No weakness.     Coordination: Coordination normal.  Psychiatric:        Mood and Affect: Mood normal.        Behavior: Behavior normal.        Thought Content: Thought content normal.        Judgment: Judgment normal.     No results found for any visits on 05/15/22.  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 She is concerned she will fall. Trial of Mobic and Robaxin to assist; referral to ortho for imaging Pt request for auto-immune w/u given 3 family members with concerns      Relevant Orders   Rheumatoid Factor   C-reactive protein   Sed Rate (ESR)   CYCLIC CITRUL PEPTIDE ANTIBODY, IGG/IGA   Ambulatory referral to Orthopedics   Family history of rheumatoid arthritis   Relevant Orders   Rheumatoid Factor   C-reactive protein   Sed Rate (ESR)   CYCLIC CITRUL PEPTIDE ANTIBODY, IGG/IGA   Return if symptoms worsen or fail to improve.     Vonna Kotyk, FNP, have reviewed all documentation for this visit. The documentation on 05/15/22 for the exam, diagnosis, procedures, and orders are all accurate and complete.  Gwyneth Sprout, [place] [phone] (phone) [phone] (fax)  [place]

## 2022-05-15 NOTE — Assessment & Plan Note (Signed)
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 She is concerned she will fall. Trial of Mobic and Robaxin to assist; referral to ortho for imaging Pt request for auto-immune w/u given 3 family members with concerns

## 2022-05-16 NOTE — Progress Notes (Signed)
Inflammatory markers, which have returned are all normal. Continue NSAID and muscle relaxant as well as stretching. Follow up with ortho if not improved in 4 weeks of conservative treatment.  Gwyneth Sprout, [address] [place], [place] [address] [phone] (phone) [phone] (fax) [place]

## 2022-05-17 LAB — C-REACTIVE PROTEIN: CRP: 6 mg/L (ref 0–10)

## 2022-05-17 LAB — CYCLIC CITRUL PEPTIDE ANTIBODY, IGG/IGA: Cyclic Citrullin Peptide Ab: 6 units (ref 0–19)

## 2022-05-17 LAB — SEDIMENTATION RATE: Sed Rate: 3 mm/hr (ref 0–40)

## 2022-05-17 LAB — RHEUMATOID FACTOR: Rheumatoid fact SerPl-aCnc: <10 IU/mL (ref <14.0)

## 2022-07-05 NOTE — Progress Notes (Unsigned)
Established patient visit  Patient: Lisa Simmons   DOB: [birth year]   62 y.o. Female  MRN: [id number] Visit Date: 07/06/2022  Today's healthcare provider: Jacky Kindle, FNP  Re Introduced to nurse practitioner role and practice setting.  All questions answered.  Discussed provider/patient relationship and expectations.  Subjective    Abdominal Pain This is a recurrent problem. The current episode started yesterday (its happens every 6 months per patient). The problem occurs constantly. The most recent episode lasted 3 hours (sometimes 30 mins to a hour or longer). The problem has been unchanged. The pain is at a severity of 10/10. The patient is experiencing no pain. The quality of the pain is aching, cramping and sharp. The abdominal pain does not radiate. Associated symptoms include nausea. Pertinent negatives include no belching, constipation or vomiting. Nothing aggravates the pain. The pain is relieved by Nothing. Treatments tried: nexium, patient just lays down till it stops. The treatment provided no relief.    Medications: Outpatient Medications Prior to Visit  Medication Sig   ALPRAZolam (XANAX) 0.25 MG tablet TAKE 1 TABLET BY MOUTH AT BEDTIME AS NEEDED FOR ANXIETY.   diphenoxylate-atropine (LOMOTIL) 2.5-0.025 MG tablet TAKE 1-2 TABLETS BY MOUTH 4 (FOUR) TIMES DAILY AS NEEDED FOR DIARRHEA OR LOOSE STOOLS.   estradiol (ESTRACE) 1 MG tablet Take 1 tablet (1 mg total) by mouth daily.   levothyroxine (SYNTHROID) 88 MCG tablet Take 88 mcg by mouth daily.   meloxicam (MOBIC) 15 MG tablet Take 1 tablet (15 mg total) by mouth daily.   methocarbamol (ROBAXIN-750) 750 MG tablet Take 1 tablet (750 mg total) by mouth every 8 (eight) hours as needed for muscle spasms.   omeprazole (PRILOSEC) 40 MG capsule TAKE 1 CAPSULE BY MOUTH EVERY DAY (Patient taking differently: PRN)   ondansetron (ZOFRAN-ODT) 4 MG disintegrating tablet Take 1 tablet (4 mg total) by mouth every 8 (eight) hours as needed  for nausea or vomiting (Take for a max of 5 days).   sertraline (ZOLOFT) 50 MG tablet Take 1 tablet (50 mg total) by mouth daily.   triamcinolone cream (KENALOG) 0.1 % Apply 1 Application topically 2 (two) times daily as needed (Rash). Apply to aa at right forearm / wrist / can also use for bug bites. Use for up to 2 weeks. Avoid applying to face, groin, and axilla. Use as directed.   Vitamin D, Ergocalciferol, (DRISDOL) 1.25 MG (50000 UNIT) CAPS capsule Take 1 capsule (50,000 Units total) by mouth every 7 (seven) days.   No facility-administered medications prior to visit.    Review of Systems  Gastrointestinal:  Positive for abdominal pain and nausea. Negative for constipation and vomiting.       Objective    BP 138/83 (BP Location: Left Arm, Patient Position: Sitting, Cuff Size: Normal)   Pulse 68   Temp 98.3 F (36.8 C) (Oral)   Resp 12   Ht  (1.6 m)   Wt 161 lb 1.6 oz (73.1 kg)   LMP 07/14/2001   SpO2 98%   BMI 28.54 kg/m    Physical Exam Vitals and nursing note reviewed.  Constitutional:      General: She is not in acute distress.    Appearance: Normal appearance. She is overweight. She is not ill-appearing, toxic-appearing or diaphoretic.  HENT:     Head: Normocephalic and atraumatic.  Cardiovascular:     Rate and Rhythm: Normal rate and regular rhythm.     Pulses: Normal pulses.  Heart sounds: Normal heart sounds. No murmur heard.    No friction rub. No gallop.  Pulmonary:     Effort: Pulmonary effort is normal. No respiratory distress.     Breath sounds: Normal breath sounds. No stridor. No wheezing, rhonchi or rales.  Chest:     Chest wall: No tenderness.  Abdominal:     General: Bowel sounds are normal. There is distension.     Palpations: Abdomen is soft. There is no mass.     Tenderness: There is no abdominal tenderness. There is no right CVA tenderness, left CVA tenderness, guarding or rebound.     Hernia: No hernia is present.  Musculoskeletal:         General: No swelling, tenderness, deformity or signs of injury. Normal range of motion.     Right lower leg: No edema.     Left lower leg: No edema.  Skin:    General: Skin is warm and dry.     Capillary Refill: Capillary refill takes less than 2 seconds.     Coloration: Skin is not jaundiced or pale.     Findings: No bruising, erythema, lesion or rash.  Neurological:     General: No focal deficit present.     Mental Status: She is alert and oriented to person, place, and time. Mental status is at baseline.     Cranial Nerves: No cranial nerve deficit.     Sensory: No sensory deficit.     Motor: No weakness.     Coordination: Coordination normal.  Psychiatric:        Mood and Affect: Mood normal.        Behavior: Behavior normal.        Thought Content: Thought content normal.        Judgment: Judgment normal.     No results found for any visits on 07/06/22.  Assessment & Plan     Problem List Items Addressed This Visit       Other   Elevated LDL cholesterol level    Repeat LP given concern for worsening abdominal pain at epigastric region The 10-year ASCVD risk score (Arnett DK, et al., 2019) is: 3.5%   Values used to calculate the score:     Age: 71 years     Sex: Female     Is Non-Hispanic African American: No     Diabetic: No     Tobacco smoker: No     Systolic Blood Pressure: 138 mmHg     Is BP treated: No     HDL Cholesterol: 73 mg/dL     Total Cholesterol: 233 mg/dL       Relevant Orders   Lipid panel   Epigastric pain - Primary    Acute on chronic, 3rd episode in 13 months Last episode 3 hours long with 4 Bms following pain episode Recommend labs for both kidney and liver function as well as enzymes Recommend restart of PPI and use of prn simethicone if needed       Relevant Orders   CBC with Differential/Platelet   Comprehensive Metabolic Panel (CMET)   Gamma GT   Lipase   Amylase   Return if symptoms worsen or fail to improve.     Leilani Merl, FNP, have reviewed all documentation for this visit. The documentation on 07/06/22 for the exam, diagnosis, procedures, and orders are all accurate and complete.  Jacky Kindle, FNP  [facility] [phone] (phone) [phone] (  fax)  [place]

## 2022-07-06 ENCOUNTER — Encounter: Payer: Self-pay | Admitting: Family Medicine

## 2022-07-06 ENCOUNTER — Ambulatory Visit: Payer: BC Managed Care – PPO | Admitting: Family Medicine

## 2022-07-06 VITALS — BP 138/83 | HR 68 | Temp 98.3°F | Resp 12 | Ht 63.0 in | Wt 161.1 lb

## 2022-07-06 DIAGNOSIS — R1013 Epigastric pain: Secondary | ICD-10-CM | POA: Diagnosis not present

## 2022-07-06 DIAGNOSIS — E7800 Pure hypercholesterolemia, unspecified: Secondary | ICD-10-CM | POA: Diagnosis not present

## 2022-07-06 MED ORDER — SIMETHICONE 80 MG PO CHEW: 80.0000 mg | CHEWABLE_TABLET | Freq: Four times a day (QID) | ORAL | 0 refills | Status: DC | PRN

## 2022-07-06 NOTE — Assessment & Plan Note (Signed)
Acute on chronic, 3rd episode in 13 months Last episode 3 hours long with 4 Bms following pain episode Recommend labs for both kidney and liver function as well as enzymes Recommend restart of PPI and use of prn simethicone if needed

## 2022-07-06 NOTE — Assessment & Plan Note (Signed)
Repeat LP given concern for worsening abdominal pain at epigastric region The 10-year ASCVD risk score (Arnett DK, et al., 2019) is: 3.5%   Values used to calculate the score:     Age: 62 years     Sex: Female     Is Non-Hispanic African American: No     Diabetic: No     Tobacco smoker: No     Systolic Blood Pressure: 138 mmHg     Is BP treated: No     HDL Cholesterol: 73 mg/dL     Total Cholesterol: 233 mg/dL

## 2022-07-07 LAB — CBC WITH DIFFERENTIAL/PLATELET
Basophils Absolute: 0 10*3/uL (ref 0.0–0.2)
Basos: 0 %
EOS (ABSOLUTE): 0.1 10*3/uL (ref 0.0–0.4)
Eos: 2 %
Hematocrit: 37.8 % (ref 34.0–46.6)
Hemoglobin: 13 g/dL (ref 11.1–15.9)
Immature Grans (Abs): 0 10*3/uL (ref 0.0–0.1)
Immature Granulocytes: 0 %
Lymphocytes Absolute: 1.9 10*3/uL (ref 0.7–3.1)
Lymphs: 28 %
MCH: 32.7 pg (ref 26.6–33.0)
MCHC: 34.4 g/dL (ref 31.5–35.7)
MCV: 95 fL (ref 79–97)
Monocytes Absolute: 0.5 10*3/uL (ref 0.1–0.9)
Monocytes: 8 %
Neutrophils Absolute: 4.1 10*3/uL (ref 1.4–7.0)
Neutrophils: 62 %
Platelets: 227 10*3/uL (ref 150–450)
RBC: 3.98 x10E6/uL (ref 3.77–5.28)
RDW: 12.6 % (ref 11.7–15.4)
WBC: 6.7 10*3/uL (ref 3.4–10.8)

## 2022-07-07 LAB — COMPREHENSIVE METABOLIC PANEL
ALT: 166 IU/L — ABNORMAL HIGH (ref 0–32)
AST: 135 IU/L — ABNORMAL HIGH (ref 0–40)
Albumin/Globulin Ratio: 1.9 (ref 1.2–2.2)
Albumin: 4.3 g/dL (ref 3.8–4.9)
Alkaline Phosphatase: 116 IU/L (ref 44–121)
BUN/Creatinine Ratio: 10 — ABNORMAL LOW (ref 12–28)
BUN: 7 mg/dL — ABNORMAL LOW (ref 8–27)
Bilirubin Total: 0.3 mg/dL (ref 0.0–1.2)
CO2: 24 mmol/L (ref 20–29)
Calcium: 8.8 mg/dL (ref 8.7–10.3)
Chloride: 103 mmol/L (ref 96–106)
Creatinine, Ser: 0.72 mg/dL (ref 0.57–1.00)
Globulin, Total: 2.3 g/dL (ref 1.5–4.5)
Glucose: 93 mg/dL (ref 70–99)
Potassium: 4.5 mmol/L (ref 3.5–5.2)
Sodium: 141 mmol/L (ref 134–144)
Total Protein: 6.6 g/dL (ref 6.0–8.5)
eGFR: 96 mL/min/{1.73_m2} (ref >59)

## 2022-07-07 LAB — LIPID PANEL
Chol/HDL Ratio: 3.1 ratio (ref 0.0–4.4)
Cholesterol, Total: 227 mg/dL — ABNORMAL HIGH (ref 100–199)
HDL: 74 mg/dL (ref >39)
LDL Chol Calc (NIH): 120 mg/dL — ABNORMAL HIGH (ref 0–99)
Triglycerides: 192 mg/dL — ABNORMAL HIGH (ref 0–149)
VLDL Cholesterol Cal: 33 mg/dL (ref 5–40)

## 2022-07-07 LAB — LIPASE: Lipase: 39 U/L (ref 14–72)

## 2022-07-07 LAB — GAMMA GT: GGT: 110 IU/L — ABNORMAL HIGH (ref 0–60)

## 2022-07-07 LAB — AMYLASE: Amylase: 38 U/L (ref 31–110)

## 2022-07-09 ENCOUNTER — Encounter: Payer: Self-pay | Admitting: Family Medicine

## 2022-07-09 ENCOUNTER — Other Ambulatory Visit: Payer: Self-pay | Admitting: Family Medicine

## 2022-07-09 DIAGNOSIS — K829 Disease of gallbladder, unspecified: Secondary | ICD-10-CM

## 2022-07-09 NOTE — Progress Notes (Signed)
Labs reflect acute gallbladder attack; can consult with general surgery for imaging and plan for removal. Cholesterol remains elevated at 120. I continue to recommend diet low in saturated fat and regular exercise - 30 min at least 5 times per week as stroke and heart attack risk remains low.  The 10-year ASCVD risk score (Arnett DK, et al., 2019) is: 3.4%   Values used to calculate the score:     Age: 62 years     Sex: Female     Is Non-Hispanic African American: No     Diabetic: No     Tobacco smoker: No     Systolic Blood Pressure: 138 mmHg     Is BP treated: No     HDL Cholesterol: 74 mg/dL     Total Cholesterol: 227 mg/dL

## 2022-07-10 ENCOUNTER — Other Ambulatory Visit: Payer: Self-pay | Admitting: Family Medicine

## 2022-07-10 DIAGNOSIS — K829 Disease of gallbladder, unspecified: Secondary | ICD-10-CM

## 2022-08-01 ENCOUNTER — Other Ambulatory Visit: Payer: Self-pay | Admitting: Surgery

## 2022-08-02 ENCOUNTER — Other Ambulatory Visit: Payer: Self-pay | Admitting: Surgery

## 2022-08-02 DIAGNOSIS — K8050 Calculus of bile duct without cholangitis or cholecystitis without obstruction: Secondary | ICD-10-CM

## 2022-08-02 DIAGNOSIS — R1011 Right upper quadrant pain: Secondary | ICD-10-CM

## 2022-08-02 DIAGNOSIS — R7989 Other specified abnormal findings of blood chemistry: Secondary | ICD-10-CM

## 2022-08-04 ENCOUNTER — Ambulatory Visit
Admission: RE | Admit: 2022-08-04 | Discharge: 2022-08-04 | Disposition: A | Discharge status: Home / Self Care | Payer: BC Managed Care – PPO | Source: Ambulatory Visit | Attending: Surgery | Admitting: Surgery

## 2022-08-04 DIAGNOSIS — R1011 Right upper quadrant pain: Secondary | ICD-10-CM

## 2022-08-04 DIAGNOSIS — K8050 Calculus of bile duct without cholangitis or cholecystitis without obstruction: Secondary | ICD-10-CM

## 2022-08-04 DIAGNOSIS — R7989 Other specified abnormal findings of blood chemistry: Secondary | ICD-10-CM | POA: Diagnosis present

## 2022-09-08 NOTE — Pre-Procedure Instructions (Signed)
Surgical Instructions    Your procedure is scheduled on September 26, 2022.  Report to [facility] Main Entrance "A" at 5:30 A.M., then check in with the Admitting office.  Call this number if you have problems the morning of surgery:  [phone]  If you have any questions prior to your surgery date call [phone]: Open Monday-Friday 8am-4pm If you experience any cold or flu symptoms such as cough, fever, chills, shortness of breath, etc. between now and your scheduled surgery, please notify us at the above number.     Remember:  Do not eat after midnight the night before your surgery  You may drink clear liquids until 4:30 AM the morning of your surgery.   Clear liquids allowed are: Water, Non-Citrus Juices (without pulp), Carbonated Beverages, Clear Tea, Black Coffee Only (NO MILK, CREAM OR POWDERED CREAMER of any kind), and Gatorade.     Take these medicines the morning of surgery with A SIP OF WATER:  estradiol (ESTRACE)   levothyroxine (SYNTHROID)   sertraline (ZOLOFT)     May take these medicines IF NEEDED:  ALPRAZolam (XANAX)   methocarbamol (ROBAXIN-750)   omeprazole (PRILOSEC)   ondansetron (ZOFRAN-ODT)    As of today, STOP taking any Aspirin (unless otherwise instructed by your surgeon) Aleve, Naproxen, Ibuprofen, Motrin, Advil, Goody's, BC's, all herbal medications, fish oil, and all vitamins. This includes your medication: meloxicam (MOBIC)                      Do NOT Smoke (Tobacco/Vaping) for 24 hours prior to your procedure.  If you use a CPAP at night, you may bring your mask/headgear for your overnight stay.   Contacts, glasses, piercing's, hearing aid's, dentures or partials may not be worn into surgery, please bring cases for these belongings.    For patients admitted to the hospital, discharge time will be determined by your treatment team.   Patients discharged the day of surgery will not be allowed to drive home, and someone needs to stay with them for  24 hours.  SURGICAL WAITING ROOM VISITATION Patients having surgery or a procedure may have no more than 2 support people in the waiting area - these visitors may rotate.   Children under the age of 68 must have an adult with them who is not the patient. If the patient needs to stay at the hospital during part of their recovery, the visitor guidelines for inpatient rooms apply. Pre-op nurse will coordinate an appropriate time for 1 support person to accompany patient in pre-op.  This support person may not rotate.   Please refer to the [facility] website for the visitor guidelines for Inpatients (after your surgery is over and you are in a regular room).    Special instructions:   [place]- Preparing For Surgery  Before surgery, you can play an important role. Because skin is not sterile, your skin needs to be as free of germs as possible. You can reduce the number of germs on your skin by washing with CHG (chlorahexidine gluconate) Soap before surgery.  CHG is an antiseptic cleaner which kills germs and bonds with the skin to continue killing germs even after washing.    Oral Hygiene is also important to reduce your risk of infection.  Remember - BRUSH YOUR TEETH THE MORNING OF SURGERY WITH YOUR REGULAR TOOTHPASTE  Please do not use if you have an allergy to CHG or antibacterial soaps. If your skin becomes reddened/irritated stop using the CHG.  Do not shave (including legs and underarms) for at least 48 hours prior to first CHG shower. It is OK to shave your face.  Please follow these instructions carefully.   Shower the NIGHT BEFORE SURGERY and the MORNING OF SURGERY  If you chose to wash your hair, wash your hair first as usual with your normal shampoo.  After you shampoo, rinse your hair and body thoroughly to remove the shampoo.  Use CHG Soap as you would any other liquid soap. You can apply CHG directly to the skin and wash gently with a scrungie or a clean washcloth.   Apply  the CHG Soap to your body ONLY FROM THE NECK DOWN.  Do not use on open wounds or open sores. Avoid contact with your eyes, ears, mouth and genitals (private parts). Wash Face and genitals (private parts)  with your normal soap.   Wash thoroughly, paying special attention to the area where your surgery will be performed.  Thoroughly rinse your body with warm water from the neck down.  DO NOT shower/wash with your normal soap after using and rinsing off the CHG Soap.  Pat yourself dry with a CLEAN TOWEL.  Wear CLEAN PAJAMAS to bed the night before surgery  Place CLEAN SHEETS on your bed the night before your surgery  DO NOT SLEEP WITH PETS.   Day of Surgery: Take a shower with CHG soap. Do not wear jewelry or makeup Do not wear lotions, powders, perfumes/colognes, or deodorant. Do not shave 48 hours prior to surgery.  Men may shave face and neck. Do not bring valuables to the hospital.  [facility] is not responsible for any belongings or valuables. Do not wear nail polish, gel polish, artificial nails, or any other type of covering on natural nails (fingers and toes) If you have artificial nails or gel coating that need to be removed by a nail salon, please have this removed prior to surgery. Artificial nails or gel coating may interfere with anesthesia's ability to adequately monitor your vital signs.  Wear Clean/Comfortable clothing the morning of surgery Remember to brush your teeth WITH YOUR REGULAR TOOTHPASTE.   Please read over the following fact sheets that you were given.    If you received a COVID test during your pre-op visit  it is requested that you wear a mask when out in public, stay away from anyone that may not be feeling well and notify your surgeon if you develop symptoms. If you have been in contact with anyone that has tested positive in the last 10 days please notify you surgeon.

## 2022-09-11 ENCOUNTER — Other Ambulatory Visit: Payer: Self-pay

## 2022-09-11 ENCOUNTER — Encounter (HOSPITAL_COMMUNITY): Payer: Self-pay

## 2022-09-11 ENCOUNTER — Encounter (HOSPITAL_COMMUNITY)
Admission: RE | Admit: 2022-09-11 | Discharge: 2022-09-11 | Disposition: A | Discharge status: Home / Self Care | Payer: BC Managed Care – PPO | Source: Ambulatory Visit | Attending: Surgery | Admitting: Surgery

## 2022-09-11 VITALS — BP 131/77 | HR 90 | Temp 98.3°F | Resp 17 | Ht 63.0 in | Wt 164.0 lb

## 2022-09-11 DIAGNOSIS — Z87891 Personal history of nicotine dependence: Secondary | ICD-10-CM | POA: Insufficient documentation

## 2022-09-11 DIAGNOSIS — Z01812 Encounter for preprocedural laboratory examination: Secondary | ICD-10-CM | POA: Diagnosis present

## 2022-09-11 DIAGNOSIS — E039 Hypothyroidism, unspecified: Secondary | ICD-10-CM | POA: Insufficient documentation

## 2022-09-11 DIAGNOSIS — Z01818 Encounter for other preprocedural examination: Secondary | ICD-10-CM

## 2022-09-11 DIAGNOSIS — K219 Gastro-esophageal reflux disease without esophagitis: Secondary | ICD-10-CM | POA: Diagnosis not present

## 2022-09-11 DIAGNOSIS — F419 Anxiety disorder, unspecified: Secondary | ICD-10-CM | POA: Insufficient documentation

## 2022-09-11 HISTORY — DX: Thyrotoxicosis, unspecified without thyrotoxic crisis or storm: E05.90

## 2022-09-11 HISTORY — DX: Gastro-esophageal reflux disease without esophagitis: K21.9

## 2022-09-11 HISTORY — DX: Hypothyroidism, unspecified: E03.9

## 2022-09-11 HISTORY — DX: Pneumonia, unspecified organism: J18.9

## 2022-09-11 LAB — CBC
HCT: 39.7 % (ref 36.0–46.0)
Hemoglobin: 13.2 g/dL (ref 12.0–15.0)
MCH: 32 pg (ref 26.0–34.0)
MCHC: 33.2 g/dL (ref 30.0–36.0)
MCV: 96.1 fL (ref 80.0–100.0)
Platelets: 245 10*3/uL (ref 150–400)
RBC: 4.13 MIL/uL (ref 3.87–5.11)
RDW: 11.9 % (ref 11.5–15.5)
WBC: 7.5 10*3/uL (ref 4.0–10.5)
nRBC: 0 % (ref 0.0–0.2)

## 2022-09-11 LAB — COMPREHENSIVE METABOLIC PANEL
ALT: 15 U/L (ref 0–44)
AST: 16 U/L (ref 15–41)
Albumin: 3.8 g/dL (ref 3.5–5.0)
Alkaline Phosphatase: 66 U/L (ref 38–126)
Anion gap: 7 (ref 5–15)
BUN: 8 mg/dL (ref 6–20)
CO2: 27 mmol/L (ref 22–32)
Calcium: 8.9 mg/dL (ref 8.9–10.3)
Chloride: 105 mmol/L (ref 98–111)
Creatinine, Ser: 0.75 mg/dL (ref 0.44–1.00)
GFR, Estimated: >60 mL/min (ref >60)
Glucose, Bld: 99 mg/dL (ref 70–99)
Potassium: 4.6 mmol/L (ref 3.5–5.1)
Sodium: 139 mmol/L (ref 135–145)
Total Bilirubin: 0.4 mg/dL (ref 0.3–1.2)
Total Protein: 6.7 g/dL (ref 6.5–8.1)

## 2022-09-11 NOTE — Progress Notes (Signed)
PCP -  Robynn Pane T. Suzie Portela NP [facility] Cardiologist - denies  Chest x-ray - Patient reports "4 years ago" EKG -  Patient reports "10 plus years ago" ECHO - Patient reports "10 plus years ago" OSA: Denies DM: Denies  GLP: Patient not taken  Blood Thinner Instructions: N/A Aspirin Instructions: N/A  NPO after midnight  COVID TEST- N/A  Anesthesia review:  Yes, Renetta Chalk PA assess  patient for heart murmur   Patient denies shortness of breath, fever, cough and chest pain at PAT appointment Patient denies respiratory illness last 2 months  All instructions explained to the patient, with a verbal understanding of the material. Patient agrees to go over the instructions while at home for a better understanding. Patient also instructed to self quarantine after being tested for COVID-19. The opportunity to ask questions was provided.

## 2022-09-11 NOTE — Pre-Procedure Instructions (Signed)
Surgical Instructions    Your procedure is scheduled on September 26, 2022.  Report to [facility] Main Entrance "A" at 5:30 A.M., then check in with the Admitting office.  Call this number if you have problems the morning of surgery:  [phone]  If you have any questions prior to your surgery date call [phone]: Open Monday-Friday 8am-4pm If you experience any cold or flu symptoms such as cough, fever, chills, shortness of breath, etc. between now and your scheduled surgery, please notify us at the above number.     Remember:  Do not eat after midnight the night before your surgery  You may drink clear liquids until 4:30 AM the morning of your surgery.   Clear liquids allowed are: Water, Non-Citrus Juices (without pulp), Carbonated Beverages, Clear Tea, Black Coffee Only (NO MILK, CREAM OR POWDERED CREAMER of any kind), and Gatorade.     Take these medicines the morning of surgery with A SIP OF WATER:  estradiol (ESTRACE)   levothyroxine (SYNTHROID)   sertraline (ZOLOFT)   liothyronine    May take these medicines IF NEEDED:   omeprazole (PRILOSEC)      As of today, STOP taking any Aspirin (unless otherwise instructed by your surgeon) Aleve, Naproxen, Ibuprofen, Motrin, Advil, Goody's, BC's, all herbal medications, fish oil, and all vitamins. This includes your medication: meloxicam (MOBIC)                      Do NOT Smoke (Tobacco/Vaping) for 24 hours prior to your procedure.  If you use a CPAP at night, you may bring your mask/headgear for your overnight stay.   Contacts, glasses, piercing's, hearing aid's, dentures or partials may not be worn into surgery, please bring cases for these belongings.    For patients admitted to the hospital, discharge time will be determined by your treatment team.   Patients discharged the day of surgery will not be allowed to drive home, and someone needs to stay with them for 24 hours.  SURGICAL WAITING ROOM VISITATION Patients having  surgery or a procedure may have no more than 2 support people in the waiting area - these visitors may rotate.   Children under the age of 23 must have an adult with them who is not the patient. If the patient needs to stay at the hospital during part of their recovery, the visitor guidelines for inpatient rooms apply. Pre-op nurse will coordinate an appropriate time for 1 support person to accompany patient in pre-op.  This support person may not rotate.   Please refer to the [facility] website for the visitor guidelines for Inpatients (after your surgery is over and you are in a regular room).    Special instructions:   - Preparing For Surgery  Before surgery, you can play an important role. Because skin is not sterile, your skin needs to be as free of germs as possible. You can reduce the number of germs on your skin by washing with CHG (chlorahexidine gluconate) Soap before surgery.  CHG is an antiseptic cleaner which kills germs and bonds with the skin to continue killing germs even after washing.    Oral Hygiene is also important to reduce your risk of infection.  Remember - BRUSH YOUR TEETH THE MORNING OF SURGERY WITH YOUR REGULAR TOOTHPASTE  Please do not use if you have an allergy to CHG or antibacterial soaps. If your skin becomes reddened/irritated stop using the CHG.  Do not shave (including legs and  underarms) for at least 48 hours prior to first CHG shower. It is OK to shave your face.  Please follow these instructions carefully.   Shower the NIGHT BEFORE SURGERY and the MORNING OF SURGERY  If you chose to wash your hair, wash your hair first as usual with your normal shampoo.  After you shampoo, rinse your hair and body thoroughly to remove the shampoo.  Use CHG Soap as you would any other liquid soap. You can apply CHG directly to the skin and wash gently with a scrungie or a clean washcloth.   Apply the CHG Soap to your body ONLY FROM THE NECK DOWN.  Do not use  on open wounds or open sores. Avoid contact with your eyes, ears, mouth and genitals (private parts). Wash Face and genitals (private parts)  with your normal soap.   Wash thoroughly, paying special attention to the area where your surgery will be performed.  Thoroughly rinse your body with warm water from the neck down.  DO NOT shower/wash with your normal soap after using and rinsing off the CHG Soap.  Pat yourself dry with a CLEAN TOWEL.  Wear CLEAN PAJAMAS to bed the night before surgery  Place CLEAN SHEETS on your bed the night before your surgery  DO NOT SLEEP WITH PETS.   Day of Surgery: Take a shower with CHG soap. Do not wear jewelry or makeup Do not wear lotions, powders, perfumes/colognes, or deodorant. Do not shave 48 hours prior to surgery.  Men may shave face and neck. Do not bring valuables to the hospital.  [facility] is not responsible for any belongings or valuables. Do not wear nail polish, gel polish, artificial nails, or any other type of covering on natural nails (fingers and toes) If you have artificial nails or gel coating that need to be removed by a nail salon, please have this removed prior to surgery. Artificial nails or gel coating may interfere with anesthesia's ability to adequately monitor your vital signs.  Wear Clean/Comfortable clothing the morning of surgery Remember to brush your teeth WITH YOUR REGULAR TOOTHPASTE.   Please read over the following fact sheets that you were given.    If you received a COVID test during your pre-op visit  it is requested that you wear a mask when out in public, stay away from anyone that may not be feeling well and notify your surgeon if you develop symptoms. If you have been in contact with anyone that has tested positive in the last 10 days please notify you surgeon.

## 2022-09-13 ENCOUNTER — Encounter (HOSPITAL_COMMUNITY): Payer: Self-pay

## 2022-09-13 NOTE — Anesthesia Preprocedure Evaluation (Addendum)
Anesthesia Evaluation  Patient identified by MRN, date of birth, ID band Patient awake    Reviewed: Allergy & Precautions, H&P , NPO status , Patient's Chart, lab work & pertinent test results  Airway Mallampati: II  TM Distance: >3 FB Neck ROM: Full    Dental no notable dental hx. (+) Teeth Intact, Dental Advisory Given   Pulmonary neg pulmonary ROS, former smoker   Pulmonary exam normal breath sounds clear to auscultation       Cardiovascular Exercise Tolerance: Good negative cardio ROS  Rhythm:Regular Rate:Normal     Neuro/Psych   Anxiety     negative neurological ROS     GI/Hepatic Neg liver ROS,GERD  Medicated,,  Endo/Other  Hypothyroidism    Renal/GU negative Renal ROS  negative genitourinary   Musculoskeletal  (+) Arthritis , Osteoarthritis,    Abdominal   Peds  Hematology  (+) Blood dyscrasia, anemia   Anesthesia Other Findings   Reproductive/Obstetrics negative OB ROS                             Anesthesia Physical Anesthesia Plan  ASA: 2  Anesthesia Plan: General   Post-op Pain Management: Tylenol PO (pre-op)*   Induction: Intravenous  PONV Risk Score and Plan: 4 or greater and Ondansetron, Dexamethasone and Midazolam  Airway Management Planned: Oral ETT  Additional Equipment:   Intra-op Plan:   Post-operative Plan: Extubation in OR  Informed Consent: I have reviewed the patients History and Physical, chart, labs and discussed the procedure including the risks, benefits and alternatives for the proposed anesthesia with the patient or authorized representative who has indicated his/her understanding and acceptance.     Dental advisory given  Plan Discussed with: CRNA  Anesthesia Plan Comments: (See PAT note from 6/24 by K Jefm Bryant PA-C )        Anesthesia Quick Evaluation
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
## 2022-09-25 NOTE — H&P (Signed)
REFERRING PHYSICIAN: Jacky Kindle, NP PROVIDER: Wayne Both, MD MRN: [id number] DOB: [birth year]  Subjective  Chief Complaint: New Consultation (Gallbladder attack, )  History of Present Illness: Lisa Simmons is a 62 y.o. female who is seen as an office consultation for evaluation of New Consultation (Gallbladder attack, )  This is a 62 year old female who is referred here for possible biliary colic. Since March of last year she has been having attacks of epigastric abdominal pain with bloating and nausea as well as diarrhea. Some of the attacks have caused 10 out of 10 abdominal pain. Liver function test did show elevated AST and ALT but normal alkaline phosphatase and bilirubin. She has not had an ultrasound but a previous CT scan from 2018 suggested there could be gallstones. She denies jaundice. She is otherwise healthy without complaints  Review of Systems: A complete review of systems was obtained from the patient. I have reviewed this information and discussed as appropriate with the patient. See HPI as well for other ROS.  ROS  Medical History: Past Medical History: Diagnosis Date Anemia Anxiety Arthritis GERD (gastroesophageal reflux disease) Seizures (CMS/HHS-HCC) Thyroid disease  There is no problem list on file for this patient.  Past Surgical History: Procedure Laterality Date CESAREAN SECTION CESAREAN SECTION HYSTERECTOMY TONSILLECTOMY   Allergies Allergen Reactions Iodine Rash Other reaction(s): Hives Moxifloxacin Dizziness Other reaction(s): Rapid pulse Penicillin G Rash  Current Outpatient Medications on File Prior to Visit Medication Sig Dispense Refill ergocalciferol, vitamin D2, 1,250 mcg (50,000 unit) capsule Take 50,000 Units by mouth every 7 (seven) days estradioL (ESTRACE) 1 MG tablet Take 1 mg by mouth once daily ALPRAZolam (XANAX) 0.25 MG tablet Take by mouth (Patient not taking: Reported on 02/17/2021) codeine-guaifenesin  10-100 mg/5 mL oral liquid Take 5 mLs by mouth every 6 (six) hours as needed for Cough (Patient not taking: Reported on 02/17/2021) 120 mL 0 diphenoxylate-atropine (LOMOTIL) 2.5-0.025 mg tablet Take by mouth (Patient not taking: Reported on 02/17/2021) fluticasone propionate (FLONASE) 50 mcg/actuation nasal spray Place 1 spray into both nostrils 2 (two) times daily 16 g 0 levothyroxine (SYNTHROID) 88 MCG tablet Take 1 tablet (88 mcg total) by mouth once daily omeprazole (PRILOSEC) 40 MG DR capsule Take 1 capsule (40 mg total) by mouth once daily (Patient not taking: Reported on 02/17/2021) ondansetron (ZOFRAN-ODT) 4 MG disintegrating tablet TAKE 1 TABLET BY MOUTH EVERY 8 HOURS AS NEEDED FOR UP TO 5 DAYS FOR NAUSEA OR VOMITING. (Patient not taking: Reported on 02/17/2021) sertraline (ZOLOFT) 50 MG tablet  No current facility-administered medications on file prior to visit.  Family History Problem Relation Age of Onset Thyroid disease Mother Diabetes type II Mother Bipolar disorder Father Alcohol abuse Father Alcohol abuse Sister No Known Problems Daughter No Known Problems Son   Social History  Tobacco Use Smoking Status Former Current packs/day: 0.00 Types: Cigarettes Quit date: 1988 Years since quitting: 36.3 Smokeless Tobacco Never   Social History  Socioeconomic History Marital status: Married Occupational History Occupation: Retired Charity fundraiser Tobacco Use Smoking status: Former Current packs/day: 0.00 Types: Cigarettes Quit date: 1988 Years since quitting: 36.3 Smokeless tobacco: Never Vaping Use Vaping status: Never Used Substance and Sexual Activity Alcohol use: Yes Comment: Once in a blue moon Drug use: Never Sexual activity: Yes Partners: Male  Objective:  Vitals:  BP: 127/79 Pulse: 88 Temp: 36.1 C (97 F) SpO2: 96% Weight: 73.5 kg (162 lb) Height: 160 cm (5\' 3" )  Body mass index is 28.7 kg/m.  Physical Exam  She appears  well on exam  Her abdomen  is soft. There is tenderness with guarding in her epigastrium and right upper quadrant which is mild  Labs, Imaging and Diagnostic Testing: I reviewed her notes from her primary care provider  Assessment and Plan:  Diagnoses and all orders for this visit:  Biliary colic - US gallbladder   I had a discussion with the patient and her husband regarding biliary colic and gallstones. I do suspect this is symptomatic gallstones but given her elevated liver function tests, I believe she does need an ultrasound to make sure there is no dilation of the bile duct or any other abnormalities and to absolutely confirm there are gallstones. They are concerned regarding surgery as they have had a family member have life-threatening complications after cholecystectomy in the past. I did discuss the surgical procedure with her in detail. We will get the ultrasound and then tentatively start working on a surgical date depending on the findings. They agree with the plans  Addendum:  ultrasound of the gallbladder showed likely small gallstones vs polyps.  Bile duct was normal in size.  Laparoscopic cholecystectomy is recommended

## 2022-09-26 ENCOUNTER — Encounter (HOSPITAL_COMMUNITY)
Admission: RE | Disposition: A | Discharge status: Home / Self Care | Payer: Self-pay | Source: Home / Self Care | Attending: Surgery

## 2022-09-26 ENCOUNTER — Other Ambulatory Visit: Payer: Self-pay

## 2022-09-26 ENCOUNTER — Ambulatory Visit (HOSPITAL_COMMUNITY): Payer: BC Managed Care – PPO

## 2022-09-26 ENCOUNTER — Ambulatory Visit (HOSPITAL_COMMUNITY)
Admission: RE | Admit: 2022-09-26 | Discharge: 2022-09-26 | Disposition: A | Discharge status: Home / Self Care | Payer: BC Managed Care – PPO | Attending: Surgery | Admitting: Surgery

## 2022-09-26 ENCOUNTER — Ambulatory Visit (HOSPITAL_COMMUNITY): Payer: BC Managed Care – PPO | Admitting: Vascular Surgery

## 2022-09-26 ENCOUNTER — Encounter (HOSPITAL_COMMUNITY): Payer: Self-pay | Admitting: Surgery

## 2022-09-26 DIAGNOSIS — K8011 Calculus of gallbladder with chronic cholecystitis with obstruction: Secondary | ICD-10-CM | POA: Insufficient documentation

## 2022-09-26 DIAGNOSIS — Z79899 Other long term (current) drug therapy: Secondary | ICD-10-CM | POA: Diagnosis not present

## 2022-09-26 DIAGNOSIS — K219 Gastro-esophageal reflux disease without esophagitis: Secondary | ICD-10-CM | POA: Insufficient documentation

## 2022-09-26 DIAGNOSIS — Z87891 Personal history of nicotine dependence: Secondary | ICD-10-CM | POA: Diagnosis not present

## 2022-09-26 DIAGNOSIS — Z9049 Acquired absence of other specified parts of digestive tract: Secondary | ICD-10-CM | POA: Insufficient documentation

## 2022-09-26 DIAGNOSIS — Z09 Encounter for follow-up examination after completed treatment for conditions other than malignant neoplasm: Secondary | ICD-10-CM | POA: Diagnosis not present

## 2022-09-26 DIAGNOSIS — K8070 Calculus of gallbladder and bile duct without cholecystitis without obstruction: Secondary | ICD-10-CM | POA: Diagnosis present

## 2022-09-26 HISTORY — PX: CHOLECYSTECTOMY: SHX55

## 2022-09-26 SURGERY — LAPAROSCOPIC CHOLECYSTECTOMY
Anesthesia: General

## 2022-09-26 MED ORDER — BUPIVACAINE-EPINEPHRINE (PF) 0.25% -1:200000 IJ SOLN
INTRAMUSCULAR | Status: AC
  Filled 2022-09-26: qty 30

## 2022-09-26 MED ORDER — DEXAMETHASONE SODIUM PHOSPHATE 10 MG/ML IJ SOLN
INTRAMUSCULAR | Status: DC | PRN
  Administered 2022-09-26: 10 mg via INTRAVENOUS

## 2022-09-26 MED ORDER — CHLORHEXIDINE GLUCONATE CLOTH 2 % EX PADS: 6.0000 | MEDICATED_PAD | Freq: Once | CUTANEOUS | Status: DC

## 2022-09-26 MED ORDER — CEFAZOLIN SODIUM-DEXTROSE 2-4 GM/100ML-% IV SOLN
2.0000 g | INTRAVENOUS | Status: AC
  Administered 2022-09-26: 2 g via INTRAVENOUS
  Filled 2022-09-26: qty 100

## 2022-09-26 MED ORDER — MIDAZOLAM HCL 2 MG/2ML IJ SOLN
INTRAMUSCULAR | Status: DC | PRN
  Administered 2022-09-26: 2 mg via INTRAVENOUS

## 2022-09-26 MED ORDER — PHENYLEPHRINE HCL-NACL 20-0.9 MG/250ML-% IV SOLN
INTRAVENOUS | Status: DC | PRN
  Administered 2022-09-26: 160 ug via INTRAVENOUS
  Administered 2022-09-26 (×3): 80 ug via INTRAVENOUS

## 2022-09-26 MED ORDER — AMISULPRIDE (ANTIEMETIC) 5 MG/2ML IV SOLN
INTRAVENOUS | Status: AC
  Filled 2022-09-26: qty 4

## 2022-09-26 MED ORDER — LACTATED RINGERS IV SOLN: INTRAVENOUS | Status: DC

## 2022-09-26 MED ORDER — DEXMEDETOMIDINE HCL IN NACL 80 MCG/20ML IV SOLN
INTRAVENOUS | Status: DC | PRN
  Administered 2022-09-26: 8 ug via INTRAVENOUS

## 2022-09-26 MED ORDER — KETOROLAC TROMETHAMINE 30 MG/ML IJ SOLN
INTRAMUSCULAR | Status: AC
  Filled 2022-09-26: qty 1

## 2022-09-26 MED ORDER — AMISULPRIDE (ANTIEMETIC) 5 MG/2ML IV SOLN
10.0000 mg | Freq: Once | INTRAVENOUS | Status: AC
  Administered 2022-09-26: 10 mg via INTRAVENOUS

## 2022-09-26 MED ORDER — LIDOCAINE 2% (20 MG/ML) 5 ML SYRINGE
INTRAMUSCULAR | Status: DC | PRN
  Administered 2022-09-26: 60 mg via INTRAVENOUS

## 2022-09-26 MED ORDER — ORAL CARE MOUTH RINSE: 15.0000 mL | Freq: Once | OROMUCOSAL | Status: AC

## 2022-09-26 MED ORDER — PROPOFOL 10 MG/ML IV BOLUS
INTRAVENOUS | Status: DC | PRN
  Administered 2022-09-26: 130 mg via INTRAVENOUS

## 2022-09-26 MED ORDER — FENTANYL CITRATE (PF) 250 MCG/5ML IJ SOLN
INTRAMUSCULAR | Status: DC | PRN
  Administered 2022-09-26: 50 ug via INTRAVENOUS
  Administered 2022-09-26: 100 ug via INTRAVENOUS

## 2022-09-26 MED ORDER — SCOPOLAMINE 1 MG/3DAYS TD PT72
MEDICATED_PATCH | TRANSDERMAL | Status: AC
  Administered 2022-09-26: 3 mg
  Filled 2022-09-26: qty 1

## 2022-09-26 MED ORDER — SUGAMMADEX SODIUM 200 MG/2ML IV SOLN
INTRAVENOUS | Status: DC | PRN
  Administered 2022-09-26: 200 mg via INTRAVENOUS

## 2022-09-26 MED ORDER — PROPOFOL 10 MG/ML IV BOLUS
INTRAVENOUS | Status: AC
  Filled 2022-09-26: qty 20

## 2022-09-26 MED ORDER — MIDAZOLAM HCL 2 MG/2ML IJ SOLN
INTRAMUSCULAR | Status: AC
  Filled 2022-09-26: qty 2

## 2022-09-26 MED ORDER — ONDANSETRON HCL 4 MG/2ML IJ SOLN
INTRAMUSCULAR | Status: DC | PRN
  Administered 2022-09-26: 4 mg via INTRAVENOUS

## 2022-09-26 MED ORDER — TRAMADOL HCL 50 MG PO TABS: 50.0000 mg | ORAL_TABLET | Freq: Four times a day (QID) | ORAL | 0 refills | Status: DC | PRN

## 2022-09-26 MED ORDER — ACETAMINOPHEN 500 MG PO TABS: 1000.0000 mg | ORAL_TABLET | ORAL | Status: AC

## 2022-09-26 MED ORDER — HYDROMORPHONE HCL 1 MG/ML IJ SOLN
INTRAMUSCULAR | Status: AC
  Filled 2022-09-26: qty 1

## 2022-09-26 MED ORDER — KETOROLAC TROMETHAMINE 30 MG/ML IJ SOLN
30.0000 mg | Freq: Once | INTRAMUSCULAR | Status: AC
  Administered 2022-09-26: 30 mg via INTRAVENOUS

## 2022-09-26 MED ORDER — CHLORHEXIDINE GLUCONATE 0.12 % MT SOLN
15.0000 mL | Freq: Once | OROMUCOSAL | Status: AC
  Administered 2022-09-26: 15 mL via OROMUCOSAL
  Filled 2022-09-26: qty 15

## 2022-09-26 MED ORDER — ROCURONIUM BROMIDE 10 MG/ML (PF) SYRINGE
PREFILLED_SYRINGE | INTRAVENOUS | Status: DC | PRN
  Administered 2022-09-26: 40 mg via INTRAVENOUS

## 2022-09-26 MED ORDER — FENTANYL CITRATE (PF) 250 MCG/5ML IJ SOLN
INTRAMUSCULAR | Status: AC
  Filled 2022-09-26: qty 5

## 2022-09-26 MED ORDER — BUPIVACAINE-EPINEPHRINE 0.25% -1:200000 IJ SOLN
INTRAMUSCULAR | Status: DC | PRN
  Administered 2022-09-26: 20 mL

## 2022-09-26 MED ORDER — HYDROMORPHONE HCL 1 MG/ML IJ SOLN
0.2500 mg | INTRAMUSCULAR | Status: DC | PRN
  Administered 2022-09-26 (×2): 0.5 mg via INTRAVENOUS

## 2022-09-26 MED ORDER — ACETAMINOPHEN 500 MG PO TABS
1000.0000 mg | ORAL_TABLET | Freq: Once | ORAL | Status: AC
  Administered 2022-09-26: 1000 mg via ORAL
  Filled 2022-09-26: qty 2

## 2022-09-26 SURGICAL SUPPLY — 40 items
ADH SKN CLS APL DERMABOND .7 (GAUZE/BANDAGES/DRESSINGS) ×1
ADH SKN CLS LQ APL DERMABOND (GAUZE/BANDAGES/DRESSINGS) ×1
APL PRP STRL LF DISP 70% ISPRP (MISCELLANEOUS) ×1
APPLIER CLIP 5 13 M/L LIGAMAX5 (MISCELLANEOUS) ×1
APR CLP MED LRG 5 ANG JAW (MISCELLANEOUS) ×1
BAG COUNTER SPONGE SURGICOUNT (BAG) ×1 IMPLANT
BAG SPNG CNTER NS LX DISP (BAG) ×1
CANISTER SUCT 3000ML PPV (MISCELLANEOUS) ×1 IMPLANT
CHLORAPREP W/TINT 26 (MISCELLANEOUS) ×1 IMPLANT
CLIP APPLIE 5 13 M/L LIGAMAX5 (MISCELLANEOUS) ×1 IMPLANT
COVER SURGICAL LIGHT HANDLE (MISCELLANEOUS) ×1 IMPLANT
DERMABOND ADVANCED .7 DNX12 (GAUZE/BANDAGES/DRESSINGS) ×1 IMPLANT
DERMABOND ADVANCED .7 DNX6 (GAUZE/BANDAGES/DRESSINGS) IMPLANT
ELECT REM PT RETURN 9FT ADLT (ELECTROSURGICAL) ×1
ELECTRODE REM PT RTRN 9FT ADLT (ELECTROSURGICAL) ×1 IMPLANT
GLOVE SURG SIGNA 7.5 PF LTX (GLOVE) ×1 IMPLANT
GOWN STRL REUS W/ TWL LRG LVL3 (GOWN DISPOSABLE) ×2 IMPLANT
GOWN STRL REUS W/ TWL XL LVL3 (GOWN DISPOSABLE) ×1 IMPLANT
GOWN STRL REUS W/TWL LRG LVL3 (GOWN DISPOSABLE) ×2
GOWN STRL REUS W/TWL XL LVL3 (GOWN DISPOSABLE) ×1
IRRIG SUCT STRYKERFLOW 2 WTIP (MISCELLANEOUS) ×1
IRRIGATION SUCT STRKRFLW 2 WTP (MISCELLANEOUS) ×1 IMPLANT
KIT BASIN OR (CUSTOM PROCEDURE TRAY) ×1 IMPLANT
KIT TURNOVER KIT B (KITS) ×1 IMPLANT
NS IRRIG 1000ML POUR BTL (IV SOLUTION) ×1 IMPLANT
PAD ARMBOARD 7.5X6 YLW CONV (MISCELLANEOUS) ×1 IMPLANT
SCISSORS LAP 5X35 DISP (ENDOMECHANICALS) ×1 IMPLANT
SET TUBE SMOKE EVAC HIGH FLOW (TUBING) ×1 IMPLANT
SLEEVE Z-THREAD 5X100MM (TROCAR) ×2 IMPLANT
SPECIMEN JAR SMALL (MISCELLANEOUS) ×1 IMPLANT
SUT MNCRL AB 4-0 PS2 18 (SUTURE) ×1 IMPLANT
SYS BAG RETRIEVAL 10MM (BASKET) ×1
SYSTEM BAG RETRIEVAL 10MM (BASKET) ×1 IMPLANT
TOWEL GREEN STERILE (TOWEL DISPOSABLE) ×1 IMPLANT
TOWEL GREEN STERILE FF (TOWEL DISPOSABLE) ×1 IMPLANT
TRAY LAPAROSCOPIC MC (CUSTOM PROCEDURE TRAY) ×1 IMPLANT
TROCAR BALLN 12MMX100 BLUNT (TROCAR) ×1 IMPLANT
TROCAR Z-THREAD OPTICAL 5X100M (TROCAR) ×1 IMPLANT
WARMER LAPAROSCOPE (MISCELLANEOUS) ×1 IMPLANT
WATER STERILE IRR 1000ML POUR (IV SOLUTION) ×1 IMPLANT

## 2022-09-26 NOTE — Discharge Instructions (Signed)
[facility] ______[facility] LAPAROSCOPIC SURGERY: POST OP INSTRUCTIONS Always review your discharge instruction sheet given to you by the facility where your surgery was performed. IF YOU HAVE DISABILITY OR FAMILY LEAVE FORMS, YOU MUST BRING THEM TO THE OFFICE FOR PROCESSING.   DO NOT GIVE THEM TO YOUR DOCTOR.  A prescription for pain medication may be given to you upon discharge.  Take your pain medication as prescribed, if needed.  If narcotic pain medicine is not needed, then you may take acetaminophen (Tylenol) or ibuprofen (Advil) as needed. Take your usually prescribed medications unless otherwise directed. If you need a refill on your pain medication, please contact your pharmacy.  They will contact our office to request authorization. Prescriptions will not be filled after 5pm or on week-ends. You should follow a light diet the first few days after arrival home, such as soup and crackers, etc.  Be sure to include lots of fluids daily. Most patients will experience some swelling and bruising in the area of the incisions.  Ice packs will help.  Swelling and bruising can take several days to resolve.  It is common to experience some constipation if taking pain medication after surgery.  Increasing fluid intake and taking a stool softener (such as Colace) will usually help or prevent this problem from occurring.  A mild laxative (Milk of Magnesia or Miralax) should be taken according to package instructions if there are no bowel movements after 48 hours. Unless discharge instructions indicate otherwise, you may remove your bandages 24-48 hours after surgery, and you may shower at that time.  You may have steri-strips (small skin tapes) in place directly over the incision.  These strips should be left on the skin for 7-10 days.  If your surgeon used skin glue on the incision, you may shower in 24 hours.  The glue will flake off over the next 2-3 weeks.  Any sutures or staples will be  removed at the office during your follow-up visit. ACTIVITIES:  You may resume regular (light) daily activities beginning the next day--such as daily self-care, walking, climbing stairs--gradually increasing activities as tolerated.  You may have sexual intercourse when it is comfortable.  Refrain from any heavy lifting or straining until approved by your doctor. You may drive when you are no longer taking prescription pain medication, you can comfortably wear a seatbelt, and you can safely maneuver your car and apply brakes. RETURN TO WORK:  __________________________________________________________ You should see your doctor in the office for a follow-up appointment approximately 2-3 weeks after your surgery.  Make sure that you call for this appointment within a day or two after you arrive home to insure a convenient appointment time. OTHER INSTRUCTIONS: YOU MAY SHOWER STARTING TOMORROW ICE PACK, TYLENOL, AND IBUPROFEN ALSO FOR PAIN NO LIFTING MORE THAN 15 POUNDS FOR 2 WEEKS __________________________________________________________________________________________________________________________ __________________________________________________________________________________________________________________________ WHEN TO CALL YOUR DOCTOR: Fever over 101.0 Inability to urinate Continued bleeding from incision. Increased pain, redness, or drainage from the incision. Increasing abdominal pain  The clinic staff is available to answer your questions during regular business hours.  Please don't hesitate to call and ask to speak to one of the nurses for clinical concerns.  If you have a medical emergency, go to the nearest emergency room or call 911.  A surgeon from [facility] is always on call at the hospital. [address], [address], [place], [place]  [address] ? [address] [address], , [place]   [address] [phone] ? [phone] ?   FAX [phone] Web site:  [email or web address] 

## 2022-09-26 NOTE — Op Note (Signed)
Laparoscopic Cholecystectomy Procedure Note  Indications: This patient presents with symptomatic gallbladder disease and will undergo laparoscopic cholecystectomy.  Pre-operative Diagnosis: symptomatic cholelithiasis  Post-operative Diagnosis: same  Surgeon: [name] Md  Assistants: Ardyth Harps, MD [facility] Resident  Anesthesia: General endotracheal anesthesia  ASA Class: 2  Procedure Details  The patient was seen again in the Holding Room. The risks, benefits, complications, treatment options, and expected outcomes were discussed with the patient. The possibilities of reaction to medication, pulmonary aspiration, perforation of viscus, bleeding, recurrent infection, finding a normal gallbladder, the need for additional procedures, failure to diagnose a condition, the possible need to convert to an open procedure, and creating a complication requiring transfusion or operation were discussed with the patient. The likelihood of improving the patient's symptoms with return to their baseline status is good.  The patient and/or family concurred with the proposed plan, giving informed consent. The site of surgery properly noted. The patient was taken to Operating Room, identified as Lisa Simmons and the procedure verified as Laparoscopic Cholecystectomy with Intraoperative Cholangiogram. A Time Out was held and the above information confirmed.  Prior to the induction of general anesthesia, antibiotic prophylaxis was administered. General endotracheal anesthesia was then administered and tolerated well. After the induction, the abdomen was prepped with Chloraprep and draped in sterile fashion. The patient was positioned in the supine position.  Local anesthetic agent was injected into the skin near the umbilicus and an incision made. We dissected down to the abdominal fascia with blunt dissection.  The fascia was incised vertically and we entered the peritoneal cavity bluntly.  A pursestring  suture of 0-Vicryl was placed around the fascial opening.  The Hasson cannula was inserted and secured with the stay suture.  Pneumoperitoneum was then created with CO2 and tolerated well without any adverse changes in the patient's vital signs. A 5-mm port was placed in the subxiphoid position.  Two 5-mm ports were placed in the right upper quadrant. All skin incisions were infiltrated with a local anesthetic agent before making the incision and placing the trocars.   We positioned the patient in reverse Trendelenburg, tilted slightly to the patient's left.  The gallbladder was identified, the fundus grasped and retracted cephalad. Adhesions were lysed bluntly and with the electrocautery where indicated, taking care not to injure any adjacent organs or viscus. The infundibulum was grasped and retracted laterally, exposing the peritoneum overlying the triangle of Calot. This was then divided and exposed in a blunt fashion. The cystic duct was clearly identified and bluntly dissected circumferentially. A critical view of the cystic duct and cystic artery was obtained.  The cystic duct was then ligated with clips and divided. The cystic artery was, dissected free, ligated with clips and divided as well.   The gallbladder was dissected from the liver bed in retrograde fashion with the electrocautery. The gallbladder was removed and placed in an Endocatch sac. The liver bed was irrigated and inspected. Hemostasis was achieved with the electrocautery. Copious irrigation was utilized and was repeatedly aspirated until clear.  The gallbladder and Endocatch sac were then removed through the umbilical port site.  The pursestring suture was used to close the umbilical fascia.    We again inspected the right upper quadrant for hemostasis.  Pneumoperitoneum was released as we removed the trocars.  4-0 Monocryl was used to close the skin.   Skin glue was then applied. The patient was then extubated and brought to the  recovery room in stable condition. Instrument, sponge,  and needle counts were correct at closure and at the conclusion of the case.   Findings: Chronic cholecystitis   Estimated Blood Loss: Minimal         Drains: none         Specimens: Gallbladder           Complications: None; patient tolerated the procedure well.         Disposition: PACU - hemodynamically stable.         Condition: stable

## 2022-09-26 NOTE — Anesthesia Postprocedure Evaluation (Signed)
Anesthesia Post Note  Patient: Lisa Simmons  Procedure(s) Performed: LAPAROSCOPIC CHOLECYSTECTOMY     Patient location during evaluation: PACU Anesthesia Type: General Level of consciousness: awake and alert Pain management: pain level controlled Vital Signs Assessment: post-procedure vital signs reviewed and stable Respiratory status: spontaneous breathing, nonlabored ventilation and respiratory function stable Cardiovascular status: blood pressure returned to baseline and stable Postop Assessment: no apparent nausea or vomiting Anesthetic complications: no  No notable events documented.  Last Vitals:  Vitals:   09/26/22 0930 09/26/22 0945  BP: 116/66 (!) 103/54  Pulse: 75   Resp: 11   Temp: 36.5 C   SpO2: 95%     Last Pain:  Vitals:   09/26/22 0845  TempSrc:   PainSc: Asleep                 Justin Meisenheimer,W. EDMOND

## 2022-09-26 NOTE — Transfer of Care (Signed)
Immediate Anesthesia Transfer of Care Note  Patient: Lisa Simmons  Procedure(s) Performed: LAPAROSCOPIC CHOLECYSTECTOMY  Patient Location: PACU  Anesthesia Type:General  Level of Consciousness: responds to stimulation  Airway & Oxygen Therapy: Patient Spontanous Breathing and Patient connected to face mask oxygen  Post-op Assessment: Report given to RN and Post -op Vital signs reviewed and stable  Post vital signs: Reviewed and stable  Last Vitals:  Vitals Value Taken Time  BP 106/60 (76)   Temp    Pulse 80   Resp 16   SpO2 100     Last Pain:  Vitals:   09/26/22 0641  TempSrc:   PainSc: 2       Patients Stated Pain Goal: 0 (09/26/22 1610)  Complications: No notable events documented.

## 2022-09-26 NOTE — Interval H&P Note (Signed)
History and Physical Interval Note:no change in H and P  09/26/2022 7:01 AM  Lisa Simmons  has presented today for surgery, with the diagnosis of BILIARY COLIC.  The various methods of treatment have been discussed with the patient and family. After consideration of risks, benefits and other options for treatment, the patient has consented to  Procedure(s): LAPAROSCOPIC CHOLECYSTECTOMY WITH POSSIBLE INTRAOPERATIVE CHOLANGIOGRAM (N/A) as a surgical intervention.  The patient's history has been reviewed, patient examined, no change in status, stable for surgery.  I have reviewed the patient's chart and labs.  Questions were answered to the patient's satisfaction.     [name]

## 2022-09-26 NOTE — Anesthesia Procedure Notes (Signed)
Procedure Name: Intubation Date/Time: 09/26/2022 7:25 AM  Performed by: Loleta Myldred Raju, CRNAPre-anesthesia Checklist: Patient identified, Patient being monitored, Timeout performed, Emergency Drugs available and Suction available Patient Re-evaluated:Patient Re-evaluated prior to induction Oxygen Delivery Method: Circle system utilized Preoxygenation: Pre-oxygenation with 100% oxygen Induction Type: IV induction Ventilation: Mask ventilation without difficulty Laryngoscope Size: Mac and 4 Grade View: Grade II Tube type: Oral Tube size: 7.0 mm Number of attempts: 1 Airway Equipment and Method: Stylet Placement Confirmation: ETT inserted through vocal cords under direct vision, positive ETCO2 and breath sounds checked- equal and bilateral Secured at: 22 cm Tube secured with: Tape Dental Injury: Teeth and Oropharynx as per pre-operative assessment

## 2022-09-27 ENCOUNTER — Encounter (HOSPITAL_COMMUNITY): Payer: Self-pay | Admitting: Surgery

## 2022-09-27 LAB — SURGICAL PATHOLOGY

## 2022-10-04 ENCOUNTER — Other Ambulatory Visit: Payer: Self-pay | Admitting: Family Medicine

## 2022-11-16 ENCOUNTER — Other Ambulatory Visit: Payer: Self-pay | Admitting: Family Medicine

## 2022-11-16 ENCOUNTER — Encounter: Payer: Self-pay | Admitting: Family Medicine

## 2022-11-16 DIAGNOSIS — L259 Unspecified contact dermatitis, unspecified cause: Secondary | ICD-10-CM

## 2022-11-16 MED ORDER — TRIAMCINOLONE ACETONIDE 0.1 % EX CREA
1.0000 | TOPICAL_CREAM | Freq: Every evening | CUTANEOUS | 0 refills | Status: DC | PRN
Start: 2022-11-16 — End: 2023-10-16

## 2022-11-29 ENCOUNTER — Encounter: Payer: BC Managed Care – PPO | Admitting: Dermatology

## 2022-12-05 ENCOUNTER — Encounter: Payer: BC Managed Care – PPO | Admitting: Dermatology

## 2023-02-07 ENCOUNTER — Encounter: Payer: Self-pay | Admitting: Family Medicine

## 2023-02-09 ENCOUNTER — Other Ambulatory Visit: Payer: Self-pay | Admitting: Family Medicine

## 2023-02-09 MED ORDER — SCOPOLAMINE 1 MG/3DAYS TD PT72: 1.0000 | MEDICATED_PATCH | TRANSDERMAL | 0 refills | Status: DC

## 2023-02-28 ENCOUNTER — Encounter: Payer: BC Managed Care – PPO | Admitting: Family Medicine

## 2023-03-12 ENCOUNTER — Ambulatory Visit: Payer: BC Managed Care – PPO | Admitting: Family Medicine

## 2023-03-12 ENCOUNTER — Encounter: Payer: BC Managed Care – PPO | Admitting: Family Medicine

## 2023-03-12 ENCOUNTER — Encounter: Payer: Self-pay | Admitting: Family Medicine

## 2023-03-12 VITALS — BP 136/80 | HR 73 | Ht 63.0 in | Wt 176.0 lb

## 2023-03-12 DIAGNOSIS — E890 Postprocedural hypothyroidism: Secondary | ICD-10-CM

## 2023-03-12 DIAGNOSIS — R635 Abnormal weight gain: Secondary | ICD-10-CM

## 2023-03-12 DIAGNOSIS — R7301 Impaired fasting glucose: Secondary | ICD-10-CM

## 2023-03-12 DIAGNOSIS — R7989 Other specified abnormal findings of blood chemistry: Secondary | ICD-10-CM | POA: Diagnosis not present

## 2023-03-12 DIAGNOSIS — Z0000 Encounter for general adult medical examination without abnormal findings: Secondary | ICD-10-CM

## 2023-03-12 DIAGNOSIS — Z780 Asymptomatic menopausal state: Secondary | ICD-10-CM

## 2023-03-12 DIAGNOSIS — E782 Mixed hyperlipidemia: Secondary | ICD-10-CM | POA: Diagnosis not present

## 2023-03-12 DIAGNOSIS — Z1231 Encounter for screening mammogram for malignant neoplasm of breast: Secondary | ICD-10-CM

## 2023-03-12 DIAGNOSIS — M8580 Other specified disorders of bone density and structure, unspecified site: Secondary | ICD-10-CM

## 2023-03-12 NOTE — Progress Notes (Signed)
Complete physical exam   Patient: Lisa Simmons   DOB: 02/15/1961   62 y.o. Female  MRN: [id number] Visit Date: 03/12/2023  Today's healthcare provider: Jacky Kindle, FNP  Introduced to nurse practitioner role and practice setting.  All questions answered.  Discussed provider/patient relationship and expectations.  Subjective    Lisa Simmons is a 62 y.o. female who presents today for a complete physical exam.  She reports consuming a general diet. Home exercise routine includes walking 1/4 hrs per day. She generally feels fairly well. She reports sleeping fairly well. She does have additional problems to discuss today.   HPI   UTD on dental and vision; advised of vaccines  Past Medical History:  Diagnosis Date   Anemia    resolved after hysterectomy fibroids and endometosis   Anxiety    GERD (gastroesophageal reflux disease)    History of chicken pox    History of measles    History of mumps    Hyperthyroidism    Hypothyroidism    due to thyroidectomy   Insomnia    Pneumonia    Seborrheic keratoses    Thyroid disease    Vulvar atrophy    Past Surgical History:  Procedure Laterality Date   ABDOMINAL HYSTERECTOMY     CESAREAN SECTION  1989   CESAREAN SECTION     CHOLECYSTECTOMY N/A 09/26/2022   Procedure: LAPAROSCOPIC CHOLECYSTECTOMY;  Surgeon: [name], MD;  Location: [facility] OR;  Service: General;  Laterality: N/A;   DILATION AND CURETTAGE OF UTERUS  1983   x2   SUPRACERVICAL ABDOMINAL HYSTERECTOMY  2003   due to Endometriosis and severe Anemia, Laproscopic; still has ovaries and cervix   THYROIDECTOMY  06/2012   Dr. Colonel Bald; due to multinodular goiter   TONSILLECTOMY  1970   Social History   Socioeconomic History   Marital status: Married    Spouse name: Not on file   Number of children: 3   Years of education: Not on file   Highest education level: Bachelor's degree (e.g., BA, AB, BS)  Occupational History   Occupation: Teacher  Tobacco  Use   Smoking status: Former    Current packs/day: 0.00    Average packs/day: 1 pack/day for 7.0 years (7.0 ttl pk-yrs)    Types: Cigarettes    Start date: 03/21/1979    Quit date: 03/20/1986    Years since quitting: 37.0   Smokeless tobacco: Never  Vaping Use   Vaping status: Never Used  Substance and Sexual Activity   Alcohol use: Never    Comment: 1-2 drinks per year   Drug use: No   Sexual activity: Yes  Other Topics Concern   Not on file  Social History Narrative   Not on file   Social Drivers of Health   Financial Resource Strain: Low Risk  (03/09/2023)   Overall Financial Resource Strain (CARDIA)    Difficulty of Paying Living Expenses: Not hard at all  Food Insecurity: No Food Insecurity (03/09/2023)   Hunger Vital Sign    Worried About Running Out of Food in the Last Year: Never true    Ran Out of Food in the Last Year: Never true  Transportation Needs: No Transportation Needs (03/09/2023)   PRAPARE - Administrator, Civil Service (Medical): No    Lack of Transportation (Non-Medical): No  Physical Activity: Insufficiently Active (03/09/2023)   Exercise Vital Sign    Days of Exercise per Week: 1 day  Minutes of Exercise per Session: 10 min  Stress: No Stress Concern Present (03/09/2023)   [employer] of Occupational Health - Occupational Stress Questionnaire    Feeling of Stress : Only a little  Social Connections: Moderately Integrated (03/09/2023)   Social Connection and Isolation Panel [NHANES]    Frequency of Communication with Friends and Family: More than three times a week    Frequency of Social Gatherings with Friends and Family: More than three times a week    Attends Religious Services: 1 to 4 times per year    Active Member of [employer] or Organizations: No    Attends Engineer, structural: Not on file    Marital Status: Married  Catering manager Violence: Not on file   Family Status  Relation Name Status   Mother  Deceased    Father  Deceased       Cause of Death: overdose; substance abuse   Sister half sister Deceased       substance abuse   Sister  Armed forces training and education officer   Sister  Alive   Mat Aunt  Alive   Pat Uncle  Deceased   MGM  (Not Specified)   MGF  Deceased   PGM  Deceased   PGF  Deceased   Neg Hx  (Not Specified)  No partnership data on file   Family History  Problem Relation Age of Onset   Hypothyroidism Mother    Diabetes Mother    Cirrhosis Mother    Heart disease Father    Depression Father    Cirrhosis Sister    Alcohol abuse Sister    Leukemia Maternal Aunt    Bladder Cancer Paternal Uncle    Heart disease Maternal Grandmother    Heart disease Maternal Grandfather    Stroke Maternal Grandfather    Heart disease Paternal Grandmother    Heart disease Paternal Grandfather    Breast cancer Neg Hx    Allergies  Allergen Reactions   Avelox  [Moxifloxacin Hcl In Nacl]     Other reaction(s): Rapid pulse   Iodine Hives   Latex     Headache and burning skin   Penicillin G Rash    Patient Care Team: Sallee Provencal, FNP as PCP - General (Family Medicine) Vernie Murders, MD (Otolaryngology)   Medications: Outpatient Medications Prior to Visit  Medication Sig   ALPRAZolam (XANAX) 0.25 MG tablet TAKE 1 TABLET BY MOUTH AT BEDTIME AS NEEDED FOR ANXIETY.   estradiol (ESTRACE) 1 MG tablet Take 1 tablet (1 mg total) by mouth daily.   levothyroxine (SYNTHROID) 100 MCG tablet Take 100 mcg by mouth daily.   liothyronine (CYTOMEL) 5 MCG tablet Take 5 mcg by mouth daily.   Multiple Vitamins-Minerals (WOMENS MULTIVITAMIN PO) Take 1 capsule by mouth daily.   omeprazole (PRILOSEC) 40 MG capsule TAKE 1 CAPSULE BY MOUTH EVERY DAY   ondansetron (ZOFRAN-ODT) 4 MG disintegrating tablet Take 1 tablet (4 mg total) by mouth every 8 (eight) hours as needed for nausea or vomiting (Take for a max of 5 days).   Probiotic Product (ALIGN PO) Take 2 capsules by mouth daily.   scopolamine (TRANSDERM-SCOP) 1 MG/3DAYS Place 1  patch (1.5 mg total) onto the skin every 3 (three) days.   triamcinolone cream (KENALOG) 0.1 % Apply 1 Application topically at bedtime as needed (Rash). Nightly application x 2 weeks to outer vaginal lips/labia for itching/irritation.   Vitamin D, Ergocalciferol, (DRISDOL) 1.25 MG (50000 UNIT) CAPS capsule Take 1 capsule (50,000 Units total)  by mouth every 7 (seven) days.   [DISCONTINUED] sertraline (ZOLOFT) 50 MG tablet Take 1 tablet (50 mg total) by mouth daily.   [DISCONTINUED] diphenoxylate-atropine (LOMOTIL) 2.5-0.025 MG tablet TAKE 1-2 TABLETS BY MOUTH 4 (FOUR) TIMES DAILY AS NEEDED FOR DIARRHEA OR LOOSE STOOLS. (Patient not taking: Reported on 09/08/2022)   [DISCONTINUED] meloxicam (MOBIC) 15 MG tablet Take 1 tablet (15 mg total) by mouth daily. (Patient not taking: Reported on 09/08/2022)   [DISCONTINUED] methocarbamol (ROBAXIN-750) 750 MG tablet Take 1 tablet (750 mg total) by mouth every 8 (eight) hours as needed for muscle spasms. (Patient not taking: Reported on 09/08/2022)   [DISCONTINUED] simethicone (GAS-X) 80 MG chewable tablet Chew 1 tablet (80 mg total) by mouth every 6 (six) hours as needed for flatulence. (Patient not taking: Reported on 09/08/2022)   [DISCONTINUED] traMADol (ULTRAM) 50 MG tablet Take 1 tablet (50 mg total) by mouth every 6 (six) hours as needed.   No facility-administered medications prior to visit.    Review of Systems Last CBC Lab Results  Component Value Date   WBC 8.3 03/12/2023   HGB 12.3 03/12/2023   HCT 37.5 03/12/2023   MCV 96 03/12/2023   MCH 31.5 03/12/2023   RDW 11.7 03/12/2023   PLT 237 03/12/2023   Last metabolic panel Lab Results  Component Value Date   GLUCOSE 110 (H) 03/12/2023   NA 143 03/12/2023   K 4.1 03/12/2023   CL 106 03/12/2023   CO2 25 03/12/2023   BUN 9 03/12/2023   CREATININE 0.80 03/12/2023   EGFR 84 03/12/2023   CALCIUM 8.9 03/12/2023   PROT 6.4 03/12/2023   ALBUMIN 3.9 03/12/2023   LABGLOB 2.5 03/12/2023    AGRATIO 1.9 07/06/2022   BILITOT <0.2 03/12/2023   ALKPHOS 93 03/12/2023   AST 13 03/12/2023   ALT 12 03/12/2023   ANIONGAP 7 09/11/2022   Last lipids Lab Results  Component Value Date   CHOL 237 (H) 03/12/2023   HDL 71 03/12/2023   LDLCALC 137 (H) 03/12/2023   TRIG 163 (H) 03/12/2023   CHOLHDL 3.3 03/12/2023   Last hemoglobin A1c Lab Results  Component Value Date   HGBA1C 5.2 03/12/2023   Last thyroid functions Lab Results  Component Value Date   TSH 1.930 03/12/2023   T4TOTAL 8.8 03/10/2016   Last vitamin D Lab Results  Component Value Date   VD25OH 25.3 (L) 03/12/2023   Last vitamin B12 and Folate Lab Results  Component Value Date   VITAMINB12 257 03/26/2020       Objective    BP 136/80 (BP Location: Left Arm, Patient Position: Sitting, Cuff Size: Normal)   Pulse 73   Ht 5\' 3"  (1.6 m)   Wt 176 lb (79.8 kg)   LMP 07/14/2001   BMI 31.18 kg/m  BP Readings from Last 3 Encounters:  03/12/23 136/80  09/26/22 (!) 103/54  09/11/22 131/77   Wt Readings from Last 3 Encounters:  03/12/23 176 lb (79.8 kg)  09/26/22 163 lb (73.9 kg)  09/11/22 164 lb (74.4 kg)   SpO2 Readings from Last 3 Encounters:  09/26/22 95%  09/11/22 100%  07/06/22 98%      Physical Exam Vitals and nursing note reviewed.  Constitutional:      General: She is awake. She is not in acute distress.    Appearance: Normal appearance. She is well-developed and well-groomed. She is obese. She is not ill-appearing, toxic-appearing or diaphoretic.  HENT:     Head: Normocephalic and atraumatic.  Jaw: There is normal jaw occlusion. No trismus, tenderness, swelling or pain on movement.     Right Ear: Hearing, tympanic membrane, ear canal and external ear normal. There is no impacted cerumen.     Left Ear: Hearing, tympanic membrane, ear canal and external ear normal. There is no impacted cerumen.     Nose: Nose normal. No congestion or rhinorrhea.     Right Turbinates: Not enlarged,  swollen or pale.     Left Turbinates: Not enlarged, swollen or pale.     Right Sinus: No maxillary sinus tenderness or frontal sinus tenderness.     Left Sinus: No maxillary sinus tenderness or frontal sinus tenderness.     Mouth/Throat:     Lips: Pink.     Mouth: Mucous membranes are moist. No injury.     Tongue: No lesions.     Pharynx: Oropharynx is clear. Uvula midline. No pharyngeal swelling, oropharyngeal exudate, posterior oropharyngeal erythema or uvula swelling.     Tonsils: No tonsillar exudate or tonsillar abscesses.  Eyes:     General: Lids are normal. Lids are everted, no foreign bodies appreciated. Vision grossly intact. Gaze aligned appropriately. No allergic shiner or visual field deficit.       Right eye: No discharge.        Left eye: No discharge.     Extraocular Movements: Extraocular movements intact.     Conjunctiva/sclera: Conjunctivae normal.     Right eye: Right conjunctiva is not injected. No exudate.    Left eye: Left conjunctiva is not injected. No exudate.    Pupils: Pupils are equal, round, and reactive to light.  Neck:     Thyroid: No thyroid mass, thyromegaly or thyroid tenderness.     Vascular: No carotid bruit.     Trachea: Trachea normal.  Cardiovascular:     Rate and Rhythm: Normal rate and regular rhythm.     Pulses: Normal pulses.          Carotid pulses are 2+ on the right side and 2+ on the left side.      Radial pulses are 2+ on the right side and 2+ on the left side.       Dorsalis pedis pulses are 2+ on the right side and 2+ on the left side.       Posterior tibial pulses are 2+ on the right side and 2+ on the left side.     Heart sounds: Normal heart sounds, S1 normal and S2 normal. No murmur heard.    No friction rub. No gallop.  Pulmonary:     Effort: Pulmonary effort is normal. No respiratory distress.     Breath sounds: Normal breath sounds and air entry. No stridor. No wheezing, rhonchi or rales.  Chest:     Chest wall: No  tenderness.  Abdominal:     General: Abdomen is flat. Bowel sounds are normal. There is no distension.     Palpations: Abdomen is soft. There is no mass.     Tenderness: There is no abdominal tenderness. There is no right CVA tenderness, left CVA tenderness, guarding or rebound.     Hernia: No hernia is present.  Genitourinary:    Comments: Exam deferred; denies complaints Musculoskeletal:        General: No swelling, tenderness, deformity or signs of injury. Normal range of motion.     Cervical back: Full passive range of motion without pain, normal range of motion and neck supple. No edema, rigidity or  tenderness. No muscular tenderness.     Right lower leg: No edema.     Left lower leg: No edema.  Lymphadenopathy:     Cervical: No cervical adenopathy.     Right cervical: No superficial, deep or posterior cervical adenopathy.    Left cervical: No superficial, deep or posterior cervical adenopathy.  Skin:    General: Skin is warm and dry.     Capillary Refill: Capillary refill takes less than 2 seconds.     Coloration: Skin is not jaundiced or pale.     Findings: No bruising, erythema, lesion or rash.  Neurological:     General: No focal deficit present.     Mental Status: She is alert and oriented to person, place, and time. Mental status is at baseline.     GCS: GCS eye subscore is 4. GCS verbal subscore is 5. GCS motor subscore is 6.     Sensory: Sensation is intact. No sensory deficit.     Motor: Motor function is intact. No weakness.     Coordination: Coordination is intact. Coordination normal.     Gait: Gait is intact. Gait normal.  Psychiatric:        Attention and Perception: Attention and perception normal.        Mood and Affect: Mood and affect normal.        Speech: Speech normal.        Behavior: Behavior normal. Behavior is cooperative.        Thought Content: Thought content normal.        Cognition and Memory: Cognition and memory normal.        Judgment:  Judgment normal.     Last depression screening scores    03/12/2023    4:04 PM 07/06/2022    1:35 PM 05/15/2022    1:10 PM  PHQ 2/9 Scores  PHQ - 2 Score 0 1 1  PHQ- 9 Score 2 4 8    Last fall risk screening    03/12/2023    4:04 PM  Fall Risk   Falls in the past year? 0  Number falls in past yr: 0  Injury with Fall? 0   Last Audit-C alcohol use screening    03/09/2023    4:15 PM  Alcohol Use Disorder Test (AUDIT)  1. How often do you have a drink containing alcohol? 1  2. How many drinks containing alcohol do you have on a typical day when you are drinking? 0  3. How often do you have six or more drinks on one occasion? 0  AUDIT-C Score 1      Patient-reported   A score of 3 or more in women, and 4 or more in men indicates increased risk for alcohol abuse, EXCEPT if all of the points are from question 1   Results for orders placed or performed in visit on 03/12/23  Comprehensive Metabolic Panel (CMET)  Result Value Ref Range   Glucose 110 (H) 70 - 99 mg/dL   BUN 9 8 - 27 mg/dL   Creatinine, Ser 9.62 0.57 - 1.00 mg/dL   eGFR 84 >95 MW/UXL/2.44   BUN/Creatinine Ratio 11 (L) 12 - 28   Sodium 143 134 - 144 mmol/L   Potassium 4.1 3.5 - 5.2 mmol/L   Chloride 106 96 - 106 mmol/L   CO2 25 20 - 29 mmol/L   Calcium 8.9 8.7 - 10.3 mg/dL   Total Protein 6.4 6.0 - 8.5 g/dL   Albumin 3.9 3.8 -  4.9 g/dL   Globulin, Total 2.5 1.5 - 4.5 g/dL   Bilirubin Total <1.6 0.0 - 1.2 mg/dL   Alkaline Phosphatase 93 44 - 121 IU/L   AST 13 0 - 40 IU/L   ALT 12 0 - 32 IU/L  CBC with Differential/Platelet  Result Value Ref Range   WBC 8.3 3.4 - 10.8 x10E3/uL   RBC 3.90 3.77 - 5.28 x10E6/uL   Hemoglobin 12.3 11.1 - 15.9 g/dL   Hematocrit 10.9 60.4 - 46.6 %   MCV 96 79 - 97 fL   MCH 31.5 26.6 - 33.0 pg   MCHC 32.8 31.5 - 35.7 g/dL   RDW 54.0 98.1 - 19.1 %   Platelets 237 150 - 450 x10E3/uL   Neutrophils 66 Not Estab. %   Lymphs 25 Not Estab. %   Monocytes 7 Not Estab. %   Eos 2 Not  Estab. %   Basos 0 Not Estab. %   Neutrophils Absolute 5.5 1.4 - 7.0 x10E3/uL   Lymphocytes Absolute 2.1 0.7 - 3.1 x10E3/uL   Monocytes Absolute 0.6 0.1 - 0.9 x10E3/uL   EOS (ABSOLUTE) 0.2 0.0 - 0.4 x10E3/uL   Basophils Absolute 0.0 0.0 - 0.2 x10E3/uL   Immature Granulocytes 0 Not Estab. %   Immature Grans (Abs) 0.0 0.0 - 0.1 x10E3/uL  Lipid panel  Result Value Ref Range   Cholesterol, Total 237 (H) 100 - 199 mg/dL   Triglycerides 478 (H) 0 - 149 mg/dL   HDL 71 >29 mg/dL   VLDL Cholesterol Cal 29 5 - 40 mg/dL   LDL Chol Calc (NIH) 562 (H) 0 - 99 mg/dL   Chol/HDL Ratio 3.3 0.0 - 4.4 ratio  Lipoprotein A (LPA)  Result Value Ref Range   Lipoprotein (a) 33.9 <75.0 nmol/L  TSH  Result Value Ref Range   TSH 1.930 0.450 - 4.500 uIU/mL  Vitamin D (25 hydroxy)  Result Value Ref Range   Vit D, 25-Hydroxy 25.3 (L) 30.0 - 100.0 ng/mL  Hemoglobin A1c  Result Value Ref Range   Hgb A1c MFr Bld 5.2 4.8 - 5.6 %   Est. average glucose Bld gHb Est-mCnc 103 mg/dL    Assessment & Plan    Routine Health Maintenance and Physical Exam  Exercise Activities and Dietary recommendations  Goals   None     Immunization History  Administered Date(s) Administered   Influenza,inj,Quad PF,6+ Mos 03/11/2019   Influenza-Unspecified 02/02/2015, 01/01/2017, 01/31/2018   Tdap 03/10/2013    Health Maintenance  Topic Date Due   COVID-19 Vaccine (1) Never done   Zoster Vaccines- Shingrix (1 of 2) Never done   DTaP/Tdap/Td (2 - Td or Tdap) 03/11/2023   INFLUENZA VACCINE  06/18/2023 (Originally 10/19/2022)   MAMMOGRAM  03/29/2024   Fecal DNA (Cologuard)  05/10/2024   Cervical Cancer Screening (HPV/Pap Cotest)  02/25/2027   Hepatitis C Screening  Completed   HIV Screening  Completed   HPV VACCINES  Aged Out    Discussed health benefits of physical activity, and encouraged her to engage in regular exercise appropriate for her age and condition.  Problem List Items Addressed This Visit        Endocrine   Hypothyroidism, postop   Chronic, stable Repeat TSH Previously noted on 100 mcg synthroid daily      Relevant Orders   TSH (Completed)   Impaired fasting glucose   Recommend A1c given previous lab elevation as well as weight gain Continue to recommend balanced, lower carb meals. Smaller meal size,  adding snacks. Choosing water as drink of choice and increasing purposeful exercise.       Relevant Orders   Hemoglobin A1c (Completed)     Musculoskeletal and Integument   Osteopenia   Chronic, repeat Vit D and MP to assist       Relevant Orders   Vitamin D (25 hydroxy) (Completed)     Other   Annual physical exam - Primary   Things to do to keep yourself healthy  - Exercise at least 30-45 minutes a day, 3-4 days a week.  - Eat a low-fat diet with lots of fruits and vegetables, up to 7-9 servings per day.  - Seatbelts can save your life. Wear them always.  - Smoke detectors on every level of your home, check batteries every year.  - Eye Doctor - have an eye exam every 1-2 years  - Safe sex - if you may be exposed to STDs, use a condom.  - Alcohol -  If you drink, do it moderately, less than 2 drinks per day.  - Health Care Power of Attorney. Choose someone to speak for you if you are not able.  - Depression is common in our stressful world.If you're feeling down or losing interest in things you normally enjoy, please come in for a visit.  - Violence - If anyone is threatening or hurting you, please call immediately.       Relevant Orders   Comprehensive Metabolic Panel (CMET) (Completed)   CBC with Differential/Platelet (Completed)   LFT elevation   Historical; repeat CMP and LP I continue to recommend diet low in saturated fat and regular exercise - 30 min at least 5 times per week Recommend reduced use of NSAIDs and/or alcohol if applicable      Mixed hyperlipidemia   Relevant Orders   Lipid panel (Completed)   Lipoprotein A (LPA) (Completed)    Post-menopausal   Recommend vit D to assist bone health      Relevant Orders   Vitamin D (25 hydroxy) (Completed)   Screening mammogram for breast cancer   Due for screening for mammogram, denies breast concerns, provided with phone number to call and schedule appointment for mammogram. Encouraged to repeat breast cancer screening every 1-2 years.       Relevant Orders   MM 3D SCREENING MAMMOGRAM BILATERAL BREAST   Weight gain   Acute weight gain Body mass index is 31.18 kg/m. Roughly 12# Discussed importance of healthy weight management Discussed diet and exercise       Relevant Orders   Hemoglobin A1c (Completed)   No follow-ups on file.    Leilani Merl, FNP, have reviewed all documentation for this visit. The documentation on 03/17/23 for the exam, diagnosis, procedures, and orders are all accurate and complete.  Jacky Kindle, FNP  [facility] [phone] (phone) [phone] (fax)  [facility]

## 2023-03-14 ENCOUNTER — Other Ambulatory Visit: Payer: Self-pay | Admitting: Family Medicine

## 2023-03-15 LAB — HEMOGLOBIN A1C
Est. average glucose Bld gHb Est-mCnc: 103 mg/dL
Hgb A1c MFr Bld: 5.2 % (ref 4.8–5.6)

## 2023-03-15 LAB — COMPREHENSIVE METABOLIC PANEL
ALT: 12 [IU]/L (ref 0–32)
AST: 13 [IU]/L (ref 0–40)
Albumin: 3.9 g/dL (ref 3.8–4.9)
Alkaline Phosphatase: 93 [IU]/L (ref 44–121)
BUN/Creatinine Ratio: 11 — ABNORMAL LOW (ref 12–28)
BUN: 9 mg/dL (ref 8–27)
Bilirubin Total: <0.2 mg/dL (ref 0.0–1.2)
CO2: 25 mmol/L (ref 20–29)
Calcium: 8.9 mg/dL (ref 8.7–10.3)
Chloride: 106 mmol/L (ref 96–106)
Creatinine, Ser: 0.8 mg/dL (ref 0.57–1.00)
Globulin, Total: 2.5 g/dL (ref 1.5–4.5)
Glucose: 110 mg/dL — ABNORMAL HIGH (ref 70–99)
Potassium: 4.1 mmol/L (ref 3.5–5.2)
Sodium: 143 mmol/L (ref 134–144)
Total Protein: 6.4 g/dL (ref 6.0–8.5)
eGFR: 84 mL/min/{1.73_m2} (ref >59)

## 2023-03-15 LAB — LIPID PANEL
Chol/HDL Ratio: 3.3 {ratio} (ref 0.0–4.4)
Cholesterol, Total: 237 mg/dL — ABNORMAL HIGH (ref 100–199)
HDL: 71 mg/dL (ref >39)
LDL Chol Calc (NIH): 137 mg/dL — ABNORMAL HIGH (ref 0–99)
Triglycerides: 163 mg/dL — ABNORMAL HIGH (ref 0–149)
VLDL Cholesterol Cal: 29 mg/dL (ref 5–40)

## 2023-03-15 LAB — CBC WITH DIFFERENTIAL/PLATELET
Basophils Absolute: 0 10*3/uL (ref 0.0–0.2)
Basos: 0 %
EOS (ABSOLUTE): 0.2 10*3/uL (ref 0.0–0.4)
Eos: 2 %
Hematocrit: 37.5 % (ref 34.0–46.6)
Hemoglobin: 12.3 g/dL (ref 11.1–15.9)
Immature Grans (Abs): 0 10*3/uL (ref 0.0–0.1)
Immature Granulocytes: 0 %
Lymphocytes Absolute: 2.1 10*3/uL (ref 0.7–3.1)
Lymphs: 25 %
MCH: 31.5 pg (ref 26.6–33.0)
MCHC: 32.8 g/dL (ref 31.5–35.7)
MCV: 96 fL (ref 79–97)
Monocytes Absolute: 0.6 10*3/uL (ref 0.1–0.9)
Monocytes: 7 %
Neutrophils Absolute: 5.5 10*3/uL (ref 1.4–7.0)
Neutrophils: 66 %
Platelets: 237 10*3/uL (ref 150–450)
RBC: 3.9 x10E6/uL (ref 3.77–5.28)
RDW: 11.7 % (ref 11.7–15.4)
WBC: 8.3 10*3/uL (ref 3.4–10.8)

## 2023-03-15 LAB — TSH: TSH: 1.93 u[IU]/mL (ref 0.450–4.500)

## 2023-03-15 LAB — VITAMIN D 25 HYDROXY (VIT D DEFICIENCY, FRACTURES): Vit D, 25-Hydroxy: 25.3 ng/mL — ABNORMAL LOW (ref 30.0–100.0)

## 2023-03-15 LAB — LIPOPROTEIN A (LPA): Lipoprotein (a): 33.9 nmol/L (ref <75.0)

## 2023-03-15 NOTE — Progress Notes (Signed)
Labs have been reviewed and show -elevated glucose on sampling -consistent elevated cholesterol with both elevated bad/LDL and fats; The 10-year ASCVD risk score (Arnett DK, et al., 2019) is: 3.6% I continue to recommend diet low in saturated fat and regular exercise - 30 min at least 5 times per week -recurrent low Vit D; recommend 5000 IU OTC to assist taken with meal

## 2023-03-17 NOTE — Assessment & Plan Note (Signed)

## 2023-03-17 NOTE — Assessment & Plan Note (Signed)
Historical; repeat CMP and LP I continue to recommend diet low in saturated fat and regular exercise - 30 min at least 5 times per week Recommend reduced use of NSAIDs and/or alcohol if applicable

## 2023-03-17 NOTE — Assessment & Plan Note (Signed)
Recommend vit D to assist bone health

## 2023-03-17 NOTE — Assessment & Plan Note (Signed)
Due for screening for mammogram, denies breast concerns, provided with phone number to call and schedule appointment for mammogram. Encouraged to repeat breast cancer screening every 1-2 years.  

## 2023-03-17 NOTE — Assessment & Plan Note (Signed)
Recommend A1c given previous lab elevation as well as weight gain Continue to recommend balanced, lower carb meals. Smaller meal size, adding snacks. Choosing water as drink of choice and increasing purposeful exercise.

## 2023-03-17 NOTE — Assessment & Plan Note (Signed)
Chronic, stable Repeat TSH Previously noted on 100 mcg synthroid daily

## 2023-03-17 NOTE — Assessment & Plan Note (Signed)
Acute weight gain Body mass index is 31.18 kg/m. Roughly 12# Discussed importance of healthy weight management Discussed diet and exercise

## 2023-03-17 NOTE — Patient Instructions (Signed)
Due for screening for mammogram, denies breast concerns, provided with phone number to call and schedule appointment for mammogram. Encouraged to repeat breast cancer screening every 1-2 years.  

## 2023-03-17 NOTE — Assessment & Plan Note (Signed)
Chronic, repeat Vit D and MP to assist

## 2023-03-24 ENCOUNTER — Other Ambulatory Visit: Payer: Self-pay | Admitting: Family Medicine

## 2023-03-24 DIAGNOSIS — N905 Atrophy of vulva: Secondary | ICD-10-CM

## 2023-03-27 NOTE — Telephone Encounter (Signed)
 Requested Prescriptions  Pending Prescriptions Disp Refills   estradiol  (ESTRACE ) 1 MG tablet [Pharmacy Med Name: ESTRADIOL  1 MG TABLET] 90 tablet 3    Sig: TAKE 1 TABLET BY MOUTH EVERY DAY     OB/GYN:  Estrogens  Passed - 03/27/2023  1:11 PM      Passed - Mammogram is up-to-date per Health Maintenance      Passed - Last BP in normal range    BP Readings from Last 1 Encounters:  03/12/23 136/80         Passed - Valid encounter within last 12 months    Recent Outpatient Visits           2 weeks ago Annual physical exam   [facility] Emilio Kelly DASEN, FNP   8 months ago Epigastric pain   [facility] Emilio Kelly T, FNP   10 months ago Bilateral hip pain   [facility] Emilio Kelly DASEN, FNP   11 months ago Rhinosinusitis   [facility] [place], [place], PA-C   1 year ago Annual physical exam   [facility] Emilio Kelly DASEN, [place]

## 2023-03-28 ENCOUNTER — Encounter: Payer: Self-pay | Admitting: Family Medicine

## 2023-03-28 DIAGNOSIS — Z1231 Encounter for screening mammogram for malignant neoplasm of breast: Secondary | ICD-10-CM

## 2023-04-05 ENCOUNTER — Ambulatory Visit
Admission: RE | Admit: 2023-04-05 | Discharge: 2023-04-05 | Disposition: A | Discharge status: Home / Self Care | Payer: 59 | Source: Ambulatory Visit | Attending: Family Medicine | Admitting: Family Medicine

## 2023-04-05 DIAGNOSIS — Z1231 Encounter for screening mammogram for malignant neoplasm of breast: Secondary | ICD-10-CM | POA: Diagnosis present

## 2023-04-09 ENCOUNTER — Ambulatory Visit
Admission: RE | Admit: 2023-04-09 | Discharge: 2023-04-09 | Disposition: A | Discharge status: Home / Self Care | Payer: 59 | Source: Ambulatory Visit | Attending: Family Medicine | Admitting: Family Medicine

## 2023-04-09 ENCOUNTER — Ambulatory Visit
Admission: RE | Admit: 2023-04-09 | Discharge: 2023-04-09 | Disposition: A | Discharge status: Home / Self Care | Payer: 59 | Source: Ambulatory Visit | Attending: Family Medicine

## 2023-04-09 ENCOUNTER — Other Ambulatory Visit: Payer: Self-pay | Admitting: Family Medicine

## 2023-04-09 DIAGNOSIS — R928 Other abnormal and inconclusive findings on diagnostic imaging of breast: Secondary | ICD-10-CM

## 2023-04-10 ENCOUNTER — Other Ambulatory Visit: Payer: Self-pay | Admitting: Family Medicine

## 2023-04-10 DIAGNOSIS — N6002 Solitary cyst of left breast: Secondary | ICD-10-CM

## 2023-04-10 DIAGNOSIS — R928 Other abnormal and inconclusive findings on diagnostic imaging of breast: Secondary | ICD-10-CM

## 2023-06-18 ENCOUNTER — Encounter: Payer: Self-pay | Admitting: Dermatology

## 2023-06-18 ENCOUNTER — Ambulatory Visit: Payer: BC Managed Care – PPO | Admitting: Dermatology

## 2023-06-18 DIAGNOSIS — W908XXA Exposure to other nonionizing radiation, initial encounter: Secondary | ICD-10-CM | POA: Diagnosis not present

## 2023-06-18 DIAGNOSIS — D229 Melanocytic nevi, unspecified: Secondary | ICD-10-CM

## 2023-06-18 DIAGNOSIS — L814 Other melanin hyperpigmentation: Secondary | ICD-10-CM

## 2023-06-18 DIAGNOSIS — Z1283 Encounter for screening for malignant neoplasm of skin: Secondary | ICD-10-CM | POA: Diagnosis not present

## 2023-06-18 DIAGNOSIS — L820 Inflamed seborrheic keratosis: Secondary | ICD-10-CM | POA: Diagnosis not present

## 2023-06-18 DIAGNOSIS — L821 Other seborrheic keratosis: Secondary | ICD-10-CM

## 2023-06-18 DIAGNOSIS — L578 Other skin changes due to chronic exposure to nonionizing radiation: Secondary | ICD-10-CM | POA: Diagnosis not present

## 2023-06-18 DIAGNOSIS — D1801 Hemangioma of skin and subcutaneous tissue: Secondary | ICD-10-CM

## 2023-06-18 DIAGNOSIS — L853 Xerosis cutis: Secondary | ICD-10-CM

## 2023-06-18 NOTE — Progress Notes (Signed)
   Follow-Up Visit   Subjective  Lisa Simmons is a 63 y.o. female who presents for the following: Skin Cancer Screening and Full Body Skin Exam, growths lower abdomen, back, has had awhile, starting to get itchy and tender  The patient presents for Total-Body Skin Exam (TBSE) for skin cancer screening and mole check. The patient has spots, moles and lesions to be evaluated, some may be new or changing and the patient may have concern these could be cancer.    The following portions of the chart were reviewed this encounter and updated as appropriate: medications, allergies, medical history  Review of Systems:  No other skin or systemic complaints except as noted in HPI or Assessment and Plan.  Objective  Well appearing patient in no apparent distress; mood and affect are within normal limits.  A full examination was performed including scalp, head, eyes, ears, nose, lips, neck, chest, axillae, abdomen, back, buttocks, bilateral upper extremities, bilateral lower extremities, hands, feet, fingers, toes, fingernails, and toenails. All findings within normal limits unless otherwise noted below.   Relevant physical exam findings are noted in the Assessment and Plan.  R inguineal crease x 2, R mid back x 1 (3) Stuck on waxy paps with erythema  Assessment & Plan   SKIN CANCER SCREENING PERFORMED TODAY.  ACTINIC DAMAGE - Chronic condition, secondary to cumulative UV/sun exposure - diffuse scaly erythematous macules with underlying dyspigmentation - Recommend daily broad spectrum sunscreen SPF 30+ to sun-exposed areas, reapply every 2 hours as needed.  - Staying in the shade or wearing long sleeves, sun glasses (UVA+UVB protection) and wide brim hats (4-inch brim around the entire circumference of the hat) are also recommended for sun protection.  - Call for new or changing lesions.  LENTIGINES, SEBORRHEIC KERATOSES, HEMANGIOMAS - Benign normal skin lesions - Benign-appearing - Call  for any changes  MELANOCYTIC NEVI - Tan-brown and/or pink-flesh-colored symmetric macules and papules - Benign appearing on exam today - Observation - Call clinic for new or changing moles - Recommend daily use of broad spectrum spf 30+ sunscreen to sun-exposed areas.   Xerosis - diffuse xerotic patches - recommend gentle, hydrating skin care - recommend Amlactin cream - gentle skin care handout given   INFLAMED SEBORRHEIC KERATOSIS (3) R inguineal crease x 2, R mid back x 1 (3) Symptomatic, irritating, patient would like treated. Destruction of lesion - R inguineal crease x 2, R mid back x 1 (3)  Destruction method: cryotherapy   Informed consent: discussed and consent obtained   Lesion destroyed using liquid nitrogen: Yes   Region frozen until ice ball extended beyond lesion: Yes   Outcome: patient tolerated procedure well with no complications   Post-procedure details: wound care instructions given   Additional details:  Prior to procedure, discussed risks of blister formation, small wound, skin dyspigmentation, or rare scar following cryotherapy. Recommend Vaseline ointment to treated areas while healing.   Return in about 1 year (around 06/17/2024) for TBSE.  I, Ardis Rowan, RMA, am acting as scribe for Willeen Niece, MD .   Documentation: I have reviewed the above documentation for accuracy and completeness, and I agree with the above.  Willeen Niece, MD

## 2023-06-18 NOTE — Patient Instructions (Addendum)
 Cryotherapy Aftercare  Wash gently with soap and water everyday.   Apply Vaseline and Band-Aid daily until healed.    Seborrheic Keratosis  What causes seborrheic keratoses? Seborrheic keratoses are harmless, common skin growths that first appear during adult life.  As time goes by, more growths appear.  Some people may develop a large number of them.  Seborrheic keratoses appear on both covered and uncovered body parts.  They are not caused by sunlight.  The tendency to develop seborrheic keratoses can be inherited.  They vary in color from skin-colored to gray, brown, or even black.  They can be either smooth or have a rough, warty surface.   Seborrheic keratoses are superficial and look as if they were stuck on the skin.  Under the microscope this type of keratosis looks like layers upon layers of skin.  That is why at times the top layer may seem to fall off, but the rest of the growth remains and re-grows.    Treatment Seborrheic keratoses do not need to be treated, but can easily be removed in the office.  Seborrheic keratoses often cause symptoms when they rub on clothing or jewelry.  Lesions can be in the way of shaving.  If they become inflamed, they can cause itching, soreness, or burning.  Removal of a seborrheic keratosis can be accomplished by freezing, burning, or surgery. If any spot bleeds, scabs, or grows rapidly, please return to have it checked, as these can be an indication of a skin cancer.  Amlactin cream for moisturizer   Gentle Skin Care Guide  1. Bathe no more than once a day.  2. Avoid bathing in hot water  3. Use a mild soap like Dove, Vanicream, Cetaphil, CeraVe. Can use Lever 2000 or Cetaphil antibacterial soap  4. Use soap only where you need it. On most days, use it under your arms, between your legs, and on your feet. Let the water rinse other areas unless visibly dirty.  5. When you get out of the bath/shower, use a towel to gently blot your skin dry,  don't rub it.  6. While your skin is still a little damp, apply a moisturizing cream such as Vanicream, CeraVe, Cetaphil, Eucerin, Sarna lotion or plain Vaseline Jelly. For hands apply Neutrogena [place] Hand Cream or Excipial Hand Cream.  7. Reapply moisturizer any time you start to itch or feel dry.  8. Sometimes using free and clear laundry detergents can be helpful. Fabric softener sheets should be avoided. Downy Free & Gentle liquid, or any liquid fabric softener that is free of dyes and perfumes, it acceptable to use  9. If your doctor has given you prescription creams you may apply moisturizers over them      Due to recent changes in healthcare laws, you may see results of your pathology and/or laboratory studies on MyChart before the doctors have had a chance to review them. We understand that in some cases there may be results that are confusing or concerning to you. Please understand that not all results are received at the same time and often the doctors may need to interpret multiple results in order to provide you with the best plan of care or course of treatment. Therefore, we ask that you please give [place] 2 business days to thoroughly review all your results before contacting the office for clarification. Should we see a critical lab result, you will be contacted sooner.   If You Need Anything After Your Visit  If you  have any questions or concerns for your doctor, please call our main line at [phone] and press option 4 to reach your doctor's medical assistant. If no one answers, please leave a voicemail as directed and we will return your call as soon as possible. Messages left after 4 pm will be answered the following business day.   You may also send [place] a message via MyChart. We typically respond to MyChart messages within 1-2 business days.  For prescription refills, please ask your pharmacy to contact our office. Our fax number is [phone].  If you have an urgent  issue when the clinic is closed that cannot wait until the next business day, you can page your doctor at the number below.    Please note that while we do our best to be available for urgent issues outside of office hours, we are not available 24/7.   If you have an urgent issue and are unable to reach [place], you may choose to seek medical care at your doctor's office, retail clinic, urgent care center, or emergency room.  If you have a medical emergency, please immediately call 911 or go to the emergency department.  Pager Numbers  - Dr. Gwen Pounds: [phone]  - Dr. Roseanne Reno: [phone]  - Dr. Katrinka Blazing: [phone]   In the event of inclement weather, please call our main line at [phone] for an update on the status of any delays or closures.  Dermatology Medication Tips: Please keep the boxes that topical medications come in in order to help keep track of the instructions about where and how to use these. Pharmacies typically print the medication instructions only on the boxes and not directly on the medication tubes.   If your medication is too expensive, please contact our office at [phone] option 4 or send [place] a message through MyChart.   We are unable to tell what your co-pay for medications will be in advance as this is different depending on your insurance coverage. However, we may be able to find a substitute medication at lower cost or fill out paperwork to get insurance to cover a needed medication.   If a prior authorization is required to get your medication covered by your insurance company, please allow [place] 1-2 business days to complete this process.  Drug prices often vary depending on where the prescription is filled and some pharmacies may offer cheaper prices.  The website www.goodrx.com contains coupons for medications through different pharmacies. The prices here do not account for what the cost may be with help from insurance (it may be cheaper with your  insurance), but the website can give you the price if you did not use any insurance.  - You can print the associated coupon and take it with your prescription to the pharmacy.  - You may also stop by our office during regular business hours and pick up a GoodRx coupon card.  - If you need your prescription sent electronically to a different pharmacy, notify our office through [facility] or by phone at [phone] option 4.     Si Usted Necesita Algo Despus de Su Visita  Tambin puede enviarnos un mensaje a travs de Clinical cytogeneticist. Por lo general respondemos a los mensajes de MyChart en el transcurso de 1 a 2 das hbiles.  Para renovar recetas, por favor pida a su farmacia que se ponga en contacto con nuestra oficina. Annie Sable de fax es [place] [phone].  Si tiene un asunto urgente cuando la clnica est cerrada  y que no puede esperar hasta el siguiente da hbil, puede llamar/localizar a su doctor(a) al nmero que aparece a continuacin.   Por favor, tenga en cuenta que aunque hacemos todo lo posible para estar disponibles para asuntos urgentes fuera del horario de [place], no estamos disponibles las 24 horas del da, los 7 [address]  [address] de la [place].   Si tiene un problema urgente y no puede comunicarse con nosotros, puede optar por buscar atencin mdica  en el consultorio de su doctor(a), en una clnica privada, en un centro de atencin urgente o en una sala de emergencias.  Si tiene Engineer, drilling, por favor llame inmediatamente al 911 o vaya a la sala de emergencias.  Nmeros de bper  - Dr. Gwen Pounds: [phone]  - Dra. Roseanne Reno: [phone]  - Dr. Katrinka Blazing: [phone]   En caso de inclemencias del tiempo, por favor llame a Lacy Duverney principal al [phone] para una actualizacin sobre el [place] de cualquier retraso o cierre.  Consejos para la medicacin en dermatologa: Por favor, guarde las cajas en las que vienen los medicamentos de uso tpico para ayudarle a  seguir las instrucciones sobre dnde y cmo usarlos. Las farmacias generalmente imprimen las instrucciones del medicamento slo en las cajas y no directamente en los tubos del [place].   Si su medicamento es muy caro, por favor, pngase en contacto con Rolm [place]ala llamando al [phone] y presione la opcin 4 o envenos un mensaje a travs de Clinical cytogeneticist.   No podemos decirle cul ser su copago por los medicamentos por adelantado ya que esto es diferente dependiendo de la cobertura de su seguro. Sin embargo, es posible que podamos encontrar un medicamento sustituto a Audiological scientist un formulario para que el seguro cubra el medicamento que se considera necesario.   Si se requiere una autorizacin previa para que su compaa de seguros [place] su medicamento, por favor permtanos de 1 a 2 das hbiles para completar [address]  Los precios de los medicamentos varan con frecuencia dependiendo del Environmental consultant de dnde se surte la receta y alguna farmacias pueden ofrecer precios ms baratos.  El sitio web www.goodrx.com tiene cupones para medicamentos de Health and safety inspector. Los precios aqu no tienen en cuenta lo que podra costar con la ayuda del seguro (puede ser ms barato con su seguro), pero el sitio web puede darle el precio si no utiliz Tourist information centre manager.  - Puede imprimir el cupn correspondiente y llevarlo con su receta a la farmacia.  - Tambin puede pasar por nuestra oficina durante el horario de atencin regular y Education officer, museum una tarjeta de cupones de GoodRx.  - Si necesita que su receta se enve electrnicamente a una farmacia diferente, informe a nuestra oficina a travs de MyChart de [place] o por telfono llamando al [phone] y presione la opcin 4.

## 2023-06-21 ENCOUNTER — Encounter: Payer: Self-pay | Admitting: Family Medicine

## 2023-06-28 ENCOUNTER — Encounter: Payer: Self-pay | Admitting: Family Medicine

## 2023-07-28 ENCOUNTER — Encounter: Payer: Self-pay | Admitting: Family Medicine

## 2023-07-28 DIAGNOSIS — G4700 Insomnia, unspecified: Secondary | ICD-10-CM

## 2023-08-01 MED ORDER — ALPRAZOLAM 0.25 MG PO TABS: 0.2500 mg | ORAL_TABLET | Freq: Every evening | ORAL | 0 refills | Status: DC | PRN

## 2023-10-02 ENCOUNTER — Other Ambulatory Visit: Payer: Self-pay | Admitting: Medical Genetics

## 2023-10-09 ENCOUNTER — Other Ambulatory Visit
Admission: RE | Admit: 2023-10-09 | Discharge: 2023-10-09 | Disposition: A | Discharge status: Home / Self Care | Payer: Self-pay | Source: Ambulatory Visit | Attending: Family Medicine | Admitting: Family Medicine

## 2023-10-09 ENCOUNTER — Ambulatory Visit
Admission: RE | Admit: 2023-10-09 | Discharge: 2023-10-09 | Disposition: A | Discharge status: Home / Self Care | Payer: 59 | Source: Ambulatory Visit | Attending: Family Medicine | Admitting: Family Medicine

## 2023-10-09 DIAGNOSIS — R928 Other abnormal and inconclusive findings on diagnostic imaging of breast: Secondary | ICD-10-CM

## 2023-10-09 DIAGNOSIS — N6002 Solitary cyst of left breast: Secondary | ICD-10-CM | POA: Insufficient documentation

## 2023-10-10 ENCOUNTER — Ambulatory Visit: Payer: Self-pay | Admitting: Family Medicine

## 2023-10-16 ENCOUNTER — Ambulatory Visit: Payer: Self-pay

## 2023-10-16 ENCOUNTER — Encounter: Payer: Self-pay | Admitting: Family Medicine

## 2023-10-16 ENCOUNTER — Ambulatory Visit: Admitting: Family Medicine

## 2023-10-16 VITALS — BP 129/75 | HR 91 | Temp 98.4°F | Resp 14 | Ht 63.0 in | Wt 174.8 lb

## 2023-10-16 DIAGNOSIS — R1030 Lower abdominal pain, unspecified: Secondary | ICD-10-CM | POA: Diagnosis not present

## 2023-10-16 DIAGNOSIS — Z889 Allergy status to unspecified drugs, medicaments and biological substances status: Secondary | ICD-10-CM | POA: Diagnosis not present

## 2023-10-16 DIAGNOSIS — R3129 Other microscopic hematuria: Secondary | ICD-10-CM

## 2023-10-16 DIAGNOSIS — K5792 Diverticulitis of intestine, part unspecified, without perforation or abscess without bleeding: Secondary | ICD-10-CM

## 2023-10-16 DIAGNOSIS — K580 Irritable bowel syndrome with diarrhea: Secondary | ICD-10-CM | POA: Diagnosis not present

## 2023-10-16 LAB — COMPREHENSIVE METABOLIC PANEL WITH GFR
ALT: 18 IU/L (ref 0–32)
AST: 14 IU/L (ref 0–40)
Albumin: 4.4 g/dL (ref 3.9–4.9)
Alkaline Phosphatase: 83 IU/L (ref 44–121)
BUN/Creatinine Ratio: 9 — ABNORMAL LOW (ref 12–28)
BUN: 7 mg/dL — ABNORMAL LOW (ref 8–27)
Bilirubin Total: 0.5 mg/dL (ref 0.0–1.2)
CO2: 23 mmol/L (ref 20–29)
Calcium: 8.9 mg/dL (ref 8.7–10.3)
Chloride: 102 mmol/L (ref 96–106)
Creatinine, Ser: 0.77 mg/dL (ref 0.57–1.00)
Globulin, Total: 2.7 g/dL (ref 1.5–4.5)
Glucose: 111 mg/dL — ABNORMAL HIGH (ref 70–99)
Potassium: 4.2 mmol/L (ref 3.5–5.2)
Sodium: 140 mmol/L (ref 134–144)
Total Protein: 7.1 g/dL (ref 6.0–8.5)
eGFR: 88 mL/min/1.73 (ref >59)

## 2023-10-16 LAB — POCT URINALYSIS DIPSTICK
Bilirubin, UA: NEGATIVE
Glucose, UA: NEGATIVE
Ketones, UA: NEGATIVE
Leukocytes, UA: NEGATIVE
Nitrite, UA: NEGATIVE
Protein, UA: NEGATIVE
Spec Grav, UA: 1.01 (ref 1.010–1.025)
Urobilinogen, UA: 0.2 U/dL
pH, UA: 6 (ref 5.0–8.0)

## 2023-10-16 LAB — CBC WITH DIFFERENTIAL/PLATELET
Basophils Absolute: 0 x10E3/uL (ref 0.0–0.2)
Basos: 0 %
EOS (ABSOLUTE): 0.1 x10E3/uL (ref 0.0–0.4)
Eos: 1 %
Hematocrit: 38.1 % (ref 34.0–46.6)
Hemoglobin: 12.7 g/dL (ref 11.1–15.9)
Immature Grans (Abs): 0 x10E3/uL (ref 0.0–0.1)
Immature Granulocytes: 0 %
Lymphocytes Absolute: 1.9 x10E3/uL (ref 0.7–3.1)
Lymphs: 17 %
MCH: 31.7 pg (ref 26.6–33.0)
MCHC: 33.3 g/dL (ref 31.5–35.7)
MCV: 95 fL (ref 79–97)
Monocytes Absolute: 1 x10E3/uL — ABNORMAL HIGH (ref 0.1–0.9)
Monocytes: 9 %
Neutrophils Absolute: 7.8 x10E3/uL — ABNORMAL HIGH (ref 1.4–7.0)
Neutrophils: 73 %
Platelets: 228 x10E3/uL (ref 150–450)
RBC: 4.01 x10E6/uL (ref 3.77–5.28)
RDW: 12.5 % (ref 11.7–15.4)
WBC: 10.7 x10E3/uL (ref 3.4–10.8)

## 2023-10-16 MED ORDER — PREDNISONE 50 MG PO TABS: ORAL_TABLET | ORAL | 0 refills | Status: DC

## 2023-10-16 MED ORDER — DIPHENHYDRAMINE HCL 25 MG PO TABS: 50.0000 mg | ORAL_TABLET | Freq: Once | ORAL | 0 refills | Status: DC

## 2023-10-16 NOTE — Addendum Note (Signed)
 Addended by: DONZELLA DOMINO on: 10/16/2023 02:58 PM   Modules accepted: Orders

## 2023-10-16 NOTE — Telephone Encounter (Signed)
 FYI Only or Action Required?: FYI only for provider.  Patient was last seen in primary care on 03/12/2023 by Emilio Kelly DASEN, FNP.  Called Nurse Triage reporting Abdominal Pain.  Symptoms began several days ago.  Interventions attempted: Nothing.  Symptoms are: unchanged.  Triage Disposition: See HCP Within 4 Hours (Or PCP Triage)  Patient/caregiver understands and will follow disposition?: Yes  **Appt. Scheduled for 7/29**                                   Copied from CRM #[id number]. Topic: Clinical - Red Word Triage >> Oct 16, 2023  8:56 AM Lisa Simmons wrote: Red Word that prompted transfer to Nurse Triage: Lower abdominal pain, cannot stand up straight. Diarrhea initially however has since subsided. Reason for Disposition  [1] MILD-MODERATE pain AND [2] constant AND [3] present > 2 hours  Answer Assessment - Initial Assessment Questions 1. LOCATION: Where does it hurt?      Lower area, now under belly button   2. RADIATION: Does the pain shoot anywhere else? (e.g., chest, back)     No   3. ONSET: When did the pain begin? (e.g., minutes, hours or days ago)      Last Sunday  4. SUDDEN: Gradual or sudden onset?     Sudden  5. PATTERN Does the pain come and go, or is it constant?     Constant   6. SEVERITY: How bad is the pain?  (e.g., Scale 1-10; mild, moderate, or severe)     5/10  7. RECURRENT SYMPTOM: Have you ever had this type of stomach pain before? If Yes, ask: When was the last time? and What happened that time?      Yes, diverticulitis several years ago  8. CAUSE: What do you think is causing the stomach pain? (e.g., gallstones, recent abdominal surgery)     Unknown  9. RELIEVING/AGGRAVATING FACTORS: What makes it better or worse? (e.g., antacids, bending or twisting motion, bowel movement)      When she has a Bowel movement it helps, last BM this morning   10. OTHER SYMPTOMS: Do you have any other  symptoms? (e.g., back pain, diarrhea, fever, urination pain, vomiting)  Decreased appetite.   Suspected gas pain, no relief.  Protocols used: Abdominal Pain - Female-A-AH

## 2023-10-16 NOTE — Progress Notes (Addendum)
 Established patient visit   Patient: Lisa Simmons   DOB: 10-Jun-1960   63 y.o. Female  MRN: [id number] Visit Date: 10/16/2023  Today's healthcare provider: LAURAINE LOISE BUOY, DO   Chief Complaint  Patient presents with   Abdominal Pain    Lower abdomen pain ongoing several days otc gas-x, motrin treatment to help and no improvements. Sitting pain is 5/10, walking and moving pain is 7/10   Subjective    Abdominal Pain Associated symptoms include diarrhea (improved today). Pertinent negatives include no fever (felt hot; but no fever on thermometer), nausea or vomiting.    Lisa Simmons is a 63 year old female with diverticulitis and irritable bowel syndrome who presents with abdominal pain.  She has been experiencing severe abdominal pain for the past 74 to 75 hours, beginning on Sunday morning. She describes the pain as hurting really bad in her lower abdomen, going side to side, and feeling real swollen. Sitting and walking exacerbate the pain, and she feels 'real swollen' in the area.  She has a history of diverticulitis but states that this pain does not feel like her previous episodes. She also has irritable bowel syndrome, predominantly with diarrhea, but her symptoms have been well-controlled since her gallbladder removal last July (2024) until this recent episode.  Regarding her bowel movements, she reports having normal but very small stools, with a significant episode of diarrhea on Sunday. On the following day, she had frequent bowel movements without diarrhea, and today she has gone twice. She mentions a light pink smear of blood in her stool on Sunday, attributing it to an internal hemorrhoid.  She has tried Motrin and Gas-X for symptom relief, but neither has been effective. She denies any new medications. No nausea, vomiting, chest pain, shortness of breath, and urinary symptoms such as pain or burning during urination.  She is not able to eat much but is forcing  herself to eat a little. She felt like she had a fever but did not have an elevated temperature when checked. She experienced a headache yesterday, which she attributes to caffeine withdrawal as she skipped her morning coffee.      Medications: Outpatient Medications Prior to Visit  Medication Sig   ALPRAZolam  (XANAX ) 0.25 MG tablet Take 1 tablet (0.25 mg total) by mouth at bedtime as needed for anxiety.   levothyroxine (SYNTHROID) 100 MCG tablet Take 100 mcg by mouth daily.   Multiple Vitamins-Minerals (WOMENS MULTIVITAMIN PO) Take 1 capsule by mouth daily.   omeprazole  (PRILOSEC) 40 MG capsule TAKE 1 CAPSULE BY MOUTH EVERY DAY   Probiotic Product (ALIGN PO) Take 2 capsules by mouth daily.   sertraline  (ZOLOFT ) 50 MG tablet TAKE 1 TABLET BY MOUTH EVERY DAY   Vitamin D , Ergocalciferol , (DRISDOL ) 1.25 MG (50000 UNIT) CAPS capsule Take 1 capsule (50,000 Units total) by mouth every 7 (seven) days.   [DISCONTINUED] estradiol  (ESTRACE ) 1 MG tablet TAKE 1 TABLET BY MOUTH EVERY DAY   [DISCONTINUED] liothyronine (CYTOMEL) 5 MCG tablet Take 5 mcg by mouth daily.   [DISCONTINUED] ondansetron  (ZOFRAN -ODT) 4 MG disintegrating tablet Take 1 tablet (4 mg total) by mouth every 8 (eight) hours as needed for nausea or vomiting (Take for a max of 5 days).   [DISCONTINUED] scopolamine  (TRANSDERM-SCOP) 1 MG/3DAYS Place 1 patch (1.5 mg total) onto the skin every 3 (three) days.   [DISCONTINUED] triamcinolone  cream (KENALOG ) 0.1 % Apply 1 Application topically at bedtime as needed (Rash). Nightly application x 2  weeks to outer vaginal lips/labia for itching/irritation.   No facility-administered medications prior to visit.    Review of Systems  Constitutional:  Positive for appetite change (reduced). Negative for chills, fatigue and fever (felt hot; but no fever on thermometer).  Respiratory:  Negative for chest tightness and shortness of breath.   Cardiovascular:  Negative for chest pain and palpitations.   Gastrointestinal:  Positive for abdominal pain, blood in stool (once on Sunday; small pink streak; has internal hemorrhoid) and diarrhea (improved today). Negative for nausea and vomiting.  Neurological:  Negative for dizziness and weakness.        Objective    BP 129/75 (BP Location: Left Arm, Patient Position: Sitting, Cuff Size: Normal)   Pulse 91   Temp 98.4 F (36.9 C) (Oral)   Resp 14   Ht 5' 3 (1.6 m)   Wt 174 lb 12.8 oz (79.3 kg)   LMP 07/14/2001   SpO2 100%   BMI 30.96 kg/m     Physical Exam Constitutional:      General: She is not in acute distress.    Appearance: She is not toxic-appearing.  Cardiovascular:     Rate and Rhythm: Normal rate.     Heart sounds: Normal heart sounds.  Pulmonary:     Effort: Pulmonary effort is normal.     Breath sounds: Normal breath sounds.  Abdominal:     General: Abdomen is protuberant. A surgical scar is present. Bowel sounds are normal. There is no distension.     Palpations: There is no hepatomegaly, splenomegaly, mass or pulsatile mass.     Tenderness: There is abdominal tenderness in the right lower quadrant, suprapubic area and left lower quadrant. There is guarding and rebound (at McBurney's point). Negative signs include Murphy's sign, Rovsing's sign, McBurney's sign, psoas sign and obturator sign.     Hernia: No hernia is present.  Neurological:     Mental Status: She is alert.      Results for orders placed or performed in visit on 10/16/23  POCT urinalysis dipstick  Result Value Ref Range   Color, UA     Clarity, UA     Glucose, UA Negative Negative   Bilirubin, UA negative    Ketones, UA negative    Spec Grav, UA 1.010 1.010 - 1.025   Blood, UA moderate    pH, UA 6.0 5.0 - 8.0   Protein, UA Negative Negative   Urobilinogen, UA 0.2 0.2 or 1.0 E.U./dL   Nitrite, UA negative    Leukocytes, UA Negative Negative   Appearance     Odor      Assessment & Plan    Lower abdominal pain -     CT ABDOMEN  PELVIS W CONTRAST; Future -     CBC with Differential/Platelet -     Comprehensive metabolic panel with GFR -     POCT urinalysis dipstick  Irritable bowel syndrome with diarrhea  Diverticulitis  History of allergy  Microscopic hematuria     Abdominal pain with bloating and diarrhea Severe suprapubic and bilateral abdominal pain with bloating and diarrhea. Pain ongoing while diarrhea has improved. OTC meds have provided no relief. Differential includes abscess vs appendicitis vs IBS. No fever (other than subjective), nausea, or vomiting.  No dysuria, urinary urgency or urinary frequency. No recent medication changes or exposure to similar symptoms. Light pink stool smear likely due to internal hemorrhoid. - POC urinalysis showed moderate blood but was otherwise negative. - Order CT scan  of the abdomen without contrast due to allergy to contrast media.  Irritable bowel syndrome with diarrhea; diverticulitis IBS with diarrhea, previously well-controlled post-cholecystectomy. Current symptoms are more severe than her usual diverticulitis/IBS and pain different from previous episodes.  Allergy to contrast media Allergy to contrast media causing nausea, dizziness, rash, and skin changes. Avoid contrast in imaging.     Return if symptoms worsen or fail to improve.      I discussed the assessment and treatment plan with the patient  The patient was provided an opportunity to ask questions and all were answered. The patient agreed with the plan and demonstrated an understanding of the instructions.   The patient was advised to call back or seek an in-person evaluation if the symptoms worsen or if the condition fails to improve as anticipated.    LAURAINE LOISE BUOY, DO  [facility] [phone] (phone) [phone] (fax)  [facility]

## 2023-10-17 ENCOUNTER — Ambulatory Visit

## 2023-10-17 ENCOUNTER — Telehealth: Payer: Self-pay

## 2023-10-17 ENCOUNTER — Ambulatory Visit: Payer: Self-pay | Admitting: Family Medicine

## 2023-10-17 ENCOUNTER — Ambulatory Visit
Admission: RE | Admit: 2023-10-17 | Discharge: 2023-10-17 | Disposition: A | Discharge status: Home / Self Care | Source: Ambulatory Visit | Attending: Family Medicine | Admitting: Family Medicine

## 2023-10-17 DIAGNOSIS — K5792 Diverticulitis of intestine, part unspecified, without perforation or abscess without bleeding: Secondary | ICD-10-CM

## 2023-10-17 DIAGNOSIS — R1030 Lower abdominal pain, unspecified: Secondary | ICD-10-CM | POA: Diagnosis present

## 2023-10-17 DIAGNOSIS — Z889 Allergy status to unspecified drugs, medicaments and biological substances status: Secondary | ICD-10-CM | POA: Diagnosis present

## 2023-10-17 MED ORDER — CIPROFLOXACIN HCL 500 MG PO TABS: 500.0000 mg | ORAL_TABLET | Freq: Two times a day (BID) | ORAL | 0 refills | Status: DC

## 2023-10-17 MED ORDER — METRONIDAZOLE 500 MG PO TABS: 500.0000 mg | ORAL_TABLET | Freq: Two times a day (BID) | ORAL | 0 refills | Status: DC

## 2023-10-17 NOTE — Addendum Note (Signed)
 Addended by: DONZELLA DOMINO on: 10/17/2023 08:15 AM   Modules accepted: Orders

## 2023-10-17 NOTE — Telephone Encounter (Signed)
 Copied from CRM [id number]. Topic: Clinical - Prescription Issue >> Oct 17, 2023  2:58 PM Lisa Simmons wrote: Reason for CRM: Pt called to report that she is missing an antibiotic that she was told would be called in for her today. Please advise   [facility] [address], [place] - [address] Lisa [place] [address] Phone: [phone] Fax: [phone]  It has to be smaller capsules or something that can be crushed because she cannot swallow large pills.

## 2023-10-18 MED ORDER — METRONIDAZOLE 500 MG/5ML PO SUSP: 500.0000 mg | Freq: Two times a day (BID) | ORAL | 0 refills | Status: AC

## 2023-10-18 MED ORDER — CIPROFLOXACIN 500 MG/5ML (10%) PO SUSR: 500.0000 mg | Freq: Two times a day (BID) | ORAL | 0 refills | Status: AC

## 2023-10-19 LAB — GENECONNECT MOLECULAR SCREEN: Genetic Analysis Overall Interpretation: NEGATIVE

## 2023-10-25 ENCOUNTER — Encounter: Payer: Self-pay | Admitting: Family Medicine

## 2023-10-25 ENCOUNTER — Other Ambulatory Visit: Payer: Self-pay | Admitting: Family Medicine

## 2023-10-25 DIAGNOSIS — B3731 Acute candidiasis of vulva and vagina: Secondary | ICD-10-CM

## 2023-10-25 MED ORDER — FLUCONAZOLE 150 MG PO TABS: ORAL_TABLET | ORAL | 0 refills | Status: DC

## 2023-10-26 ENCOUNTER — Telehealth: Payer: Self-pay | Admitting: Family Medicine

## 2023-10-26 MED ORDER — OMEPRAZOLE 40 MG PO CPDR: DELAYED_RELEASE_CAPSULE | ORAL | 4 refills | Status: AC

## 2023-10-26 NOTE — Telephone Encounter (Signed)
[facility] faxed refill request for the following medications: ? ?omeprazole (PRILOSEC) 40 MG capsule  ? ?Please advise. ? ? ? ?

## 2023-11-30 ENCOUNTER — Encounter: Payer: Self-pay | Admitting: Family Medicine

## 2023-11-30 NOTE — Telephone Encounter (Signed)
 Unfortunately, while she was treated for Dr. Donzella end of July for this problem. She really needs to be assessed in person first for this issue. I have had never seen her as a patient before. Recommend urgent care or scheduling appointment.

## 2024-02-01 ENCOUNTER — Ambulatory Visit: Payer: Self-pay

## 2024-02-01 NOTE — Telephone Encounter (Signed)
  FYI Only or Action Required?: FYI only for provider: appointment scheduled on 02/04/2024.  Patient was last seen in primary care on 10/16/2023 by Donzella Lauraine SAILOR, DO.  Called Nurse Triage reporting Headache.  Symptoms began about a month ago.  Interventions attempted: OTC medications: ibuprofen and Other: postural change.  Symptoms are: gradually worsening.  Triage Disposition: See PCP When Office is Open (Within 3 Days)  Patient/caregiver understands and will follow disposition?: Yes Copied from CRM #[id number]. Topic: Clinical - Red Word Triage >> Feb 01, 2024 10:04 AM Joesph NOVAK wrote: Red Word that prompted transfer to Nurse Triage: Headache when she bends over or sudden movements. Reason for Disposition  [1] MILD-MODERATE headache AND [2] present > 3 days (72 hours) AND [3] no improvement after using Care Advice  Answer Assessment - Initial Assessment Questions 1. LOCATION: Where does it hurt?      Top of head  2. ONSET: When did the headache start? (e.g., minutes, hours, days)      One month ago 3. PATTERN: Does the pain come and go, or has it been constant since it started?     Comes and goes 4. SEVERITY: How bad is the pain? and What does it keep you from doing?  (e.g., Scale 1-10; mild, moderate, or severe)     Describes pain as moderate 5. RECURRENT SYMPTOM: Have you ever had headaches before? If Yes, ask: When was the last time? and What happened that time?      denies 6. CAUSE: What do you think is causing the headache?     When patient coughs, sneezes, or laughs, she has pain that feels like her head is going to explode.  Also happens if she bends over.   7. MIGRAINE: Have you been diagnosed with migraine headaches? If Yes, ask: Is this headache similar?      Has not had migraines since hysterectomy  8. HEAD INJURY: Has there been any recent injury to your head?      denies 9. OTHER SYMPTOMS: Do you have any other symptoms? (e.g., fever, stiff  neck, eye pain, sore throat, cold symptoms)     States that she carries her stress in her neck and normally has a stiff neck 10. PREGNANCY: Is there any chance you are pregnant? When was your last menstrual period?       N/a  Protocols used: Headache-A-AH

## 2024-02-04 ENCOUNTER — Ambulatory Visit: Admitting: Family Medicine

## 2024-02-04 ENCOUNTER — Encounter: Payer: Self-pay | Admitting: Family Medicine

## 2024-02-04 VITALS — BP 125/82 | HR 83 | Resp 16 | Ht 63.0 in | Wt 173.0 lb

## 2024-02-04 DIAGNOSIS — R51 Headache with orthostatic component, not elsewhere classified: Secondary | ICD-10-CM | POA: Diagnosis not present

## 2024-02-04 DIAGNOSIS — R0789 Other chest pain: Secondary | ICD-10-CM

## 2024-02-04 MED ORDER — ALBUTEROL SULFATE HFA 108 (90 BASE) MCG/ACT IN AERS: 2.0000 | INHALATION_SPRAY | Freq: Four times a day (QID) | RESPIRATORY_TRACT | 0 refills | Status: AC | PRN

## 2024-02-04 NOTE — Progress Notes (Signed)
 Acute Office Visit  Introduced to nurse practitioner role and practice setting.  All questions answered.  Discussed provider/patient relationship and expectations.   Subjective:     Patient ID: Lisa Simmons, female    DOB: 1960/08/10, 63 y.o.   MRN: [id number]  Chief Complaint  Patient presents with   Acute Visit   Headache   Discussed the use of AI scribe software for clinical note transcription with the patient, who gave verbal consent to proceed.  History of Present Illness Lisa Simmons is a 63 year old female who presents with positional headaches and chest tightness.  She experiences severe pressure-like headaches that occur when bending down or laughing. The pain is described as severe pressure, 'like it's gonna just pop off,' and resolves when she sits back up, although a little pressure remains. Motrin did not alleviate the pain, and she has not taken any other medication since. The headaches do not occur when sleeping, but she feels them intermittently when she first lays down. No vision changes, hearing changes, numbness, tingling, dizziness, or light sensitivity are associated with these headaches.  She also experiences chest tightness, which started yesterday morning along with a bout of heartburn. She takes omeprazole  daily for acid reflux, which usually affects her throat, but this episode felt different. Today, she reports no heartburn but persistent chest tightness. She has a history of bronchitis and pneumonia, during which she has used inhalers. She is concerned she may be developing a lung infection  Her past medical history includes migraines associated with her menstrual cycle, which resolved after a hysterectomy that left her cervix and ovaries intact. She has a history of vasovagal syncope during severe vomiting episodes. She takes Singulair, Zyrtec, and Flonase for allergies, which have been well-controlled this year. She has not had any recent changes to her  medications or any recent travel.  She has a known severe allergy to iodine contrast, which causes severe reactions including rash and skin peeling. She has had imaging done in the past for migraines, which were attributed to hormonal changes.   HPI  ROS      Objective:    BP 125/82 (BP Location: Right Leg, Patient Position: Sitting, Cuff Size: Normal)   Pulse 83   Resp 16   Ht 5' 3 (1.6 m)   Wt 173 lb (78.5 kg)   LMP 07/14/2001   SpO2 98%   BMI 30.65 kg/m    Physical Exam Constitutional:      General: She is not in acute distress.    Appearance: Normal appearance. She is not ill-appearing, toxic-appearing or diaphoretic.  HENT:     Head: Normocephalic.     Nose: Nose normal.     Mouth/Throat:     Mouth: Mucous membranes are moist.     Pharynx: Oropharynx is clear.  Eyes:     General: No visual field deficit.    Extraocular Movements: Extraocular movements intact.     Pupils: Pupils are equal, round, and reactive to light.  Cardiovascular:     Rate and Rhythm: Normal rate and regular rhythm.     Pulses: Normal pulses.     Heart sounds: Normal heart sounds. No murmur heard.    No friction rub. No gallop.  Pulmonary:     Effort: No respiratory distress.     Breath sounds: No stridor. No wheezing, rhonchi or rales.  Chest:     Chest wall: No tenderness.  Musculoskeletal:     Right lower leg:  No edema.     Left lower leg: No edema.  Skin:    General: Skin is warm and dry.     Capillary Refill: Capillary refill takes less than 2 seconds.  Neurological:     General: No focal deficit present.     Mental Status: She is alert and oriented to person, place, and time. Mental status is at baseline.     GCS: GCS eye subscore is 4. GCS verbal subscore is 5. GCS motor subscore is 6.     Cranial Nerves: No cranial nerve deficit, dysarthria or facial asymmetry.     Sensory: No sensory deficit.     Motor: No weakness.     Coordination: Romberg sign negative. Coordination  normal.     Gait: Gait normal.  Psychiatric:        Mood and Affect: Mood normal. Mood is not anxious or depressed.        Speech: Speech normal.        Behavior: Behavior normal. Behavior is not agitated.        Thought Content: Thought content normal.        Cognition and Memory: Cognition is not impaired. Memory is not impaired.        Judgment: Judgment normal.     No results found for any visits on 02/04/24.      Assessment & Plan:  Assessment and Plan Assessment & Plan Postural Headache - new onset - described as severe pressure, exacerbated by bending over and relieved by standing up. - No associated vision changes, hearing changes, or neurological deficits. - denies syncope, dizziness, or lightheadedness - Differential includes neuro structural change, Sinus related issue, blood pressure control. - No history of similar headaches. Had migraines, hormonal related prior to hysterectomy (uretus only removed) - concerning give age, new onset, changes with posture - new vision changes, wears glass, may need vision/eyes assessed to ensure normal pressure - relieved with standing up, has taken acetaminophen  and ibuprofen for it - denies HA today, but did has some pressure, frontally, when laying down for exam. - Ordered head CT to rule out underlying changes - Ordered CBC, CMP, Lipid, CRP. ESR, A1C - Advised to discuss with ENT if sinus-related issues are suspected - Advised to stay hydrated - continue prn acetaminophen  and ibuprofen   Chest tightness - breath sounds clear and heart sounds normal - States when takes a deep breath in feels tight - hx of intermittent albuterol use with seasonal changes - takes daily Singulair and zyrtec for chronic environmental allergies - History of bronchitis and pneumonia.  - Symptoms may be related to seasonal change, reactive airway response - just started yesterday - Will order albuterol for help aid in bronchodilation - Will  consider steroid pack if symptoms persist  Allergic rhinitis No recent allergy symptoms reported, but current headache may be related to sinus inflammation. - Continue current allergy medications (zyrtec and singulair) - Discuss with ENT if sinus-related issues are suspected   Problem List Items Addressed This Visit   None Visit Diagnoses       Postural headache    -  Primary   Relevant Orders   CT HEAD WO CONTRAST ( )   CBC   Comprehensive metabolic panel with GFR   Hemoglobin A1c   Lipid panel   Sed Rate (ESR)   C-reactive protein     Chest tightness       Relevant Medications   albuterol (VENTOLIN HFA) 108 (90 Base) MCG/ACT  inhaler       Meds ordered this encounter  Medications   albuterol (VENTOLIN HFA) 108 (90 Base) MCG/ACT inhaler    Sig: Inhale 2 puffs into the lungs every 6 (six) hours as needed for wheezing or shortness of breath.    Dispense:  8 g    Refill:  0    Return if symptoms worsen or fail to improve.  Curtis DELENA Boom, FNP  I, Curtis DELENA Boom, FNP, have reviewed all documentation for this visit. The documentation on 02/04/24 for the exam, diagnosis, procedures, and orders are all accurate and complete.

## 2024-02-05 ENCOUNTER — Ambulatory Visit: Payer: Self-pay | Admitting: Family Medicine

## 2024-02-05 LAB — COMPREHENSIVE METABOLIC PANEL WITH GFR
ALT: 18 IU/L (ref 0–32)
AST: 18 IU/L (ref 0–40)
Albumin: 4.2 g/dL (ref 3.9–4.9)
Alkaline Phosphatase: 100 IU/L (ref 49–135)
BUN/Creatinine Ratio: 8 — ABNORMAL LOW (ref 12–28)
BUN: 6 mg/dL — ABNORMAL LOW (ref 8–27)
Bilirubin Total: 0.3 mg/dL (ref 0.0–1.2)
CO2: 26 mmol/L (ref 20–29)
Calcium: 8.7 mg/dL (ref 8.7–10.3)
Chloride: 102 mmol/L (ref 96–106)
Creatinine, Ser: 0.8 mg/dL (ref 0.57–1.00)
Globulin, Total: 2.6 g/dL (ref 1.5–4.5)
Glucose: 126 mg/dL — ABNORMAL HIGH (ref 70–99)
Potassium: 3.9 mmol/L (ref 3.5–5.2)
Sodium: 141 mmol/L (ref 134–144)
Total Protein: 6.8 g/dL (ref 6.0–8.5)
eGFR: 84 mL/min/1.73 (ref >59)

## 2024-02-05 LAB — C-REACTIVE PROTEIN: CRP: 23 mg/L — ABNORMAL HIGH (ref 0–10)

## 2024-02-05 LAB — LIPID PANEL
Chol/HDL Ratio: 3.3 ratio (ref 0.0–4.4)
Cholesterol, Total: 206 mg/dL — ABNORMAL HIGH (ref 100–199)
HDL: 63 mg/dL (ref >39)
LDL Chol Calc (NIH): 121 mg/dL — ABNORMAL HIGH (ref 0–99)
Triglycerides: 126 mg/dL (ref 0–149)
VLDL Cholesterol Cal: 22 mg/dL (ref 5–40)

## 2024-02-05 LAB — CBC
Hematocrit: 39.8 % (ref 34.0–46.6)
Hemoglobin: 12.9 g/dL (ref 11.1–15.9)
MCH: 31.1 pg (ref 26.6–33.0)
MCHC: 32.4 g/dL (ref 31.5–35.7)
MCV: 96 fL (ref 79–97)
Platelets: 245 x10E3/uL (ref 150–450)
RBC: 4.15 x10E6/uL (ref 3.77–5.28)
RDW: 13.2 % (ref 11.7–15.4)
WBC: 9.5 x10E3/uL (ref 3.4–10.8)

## 2024-02-05 LAB — HEMOGLOBIN A1C
Est. average glucose Bld gHb Est-mCnc: 108 mg/dL
Hgb A1c MFr Bld: 5.4 % (ref 4.8–5.6)

## 2024-02-05 LAB — SEDIMENTATION RATE: Sed Rate: 9 mm/h (ref 0–40)

## 2024-02-20 ENCOUNTER — Ambulatory Visit: Admitting: Dermatology

## 2024-02-26 ENCOUNTER — Telehealth: Payer: Self-pay | Admitting: Family Medicine

## 2024-02-26 ENCOUNTER — Other Ambulatory Visit: Payer: Self-pay

## 2024-02-26 MED ORDER — SERTRALINE HCL 50 MG PO TABS: 50.0000 mg | ORAL_TABLET | Freq: Every day | ORAL | 0 refills | Status: DC

## 2024-02-26 NOTE — Telephone Encounter (Signed)
 Converted to rx request

## 2024-02-26 NOTE — Telephone Encounter (Signed)
 LOV- 02/04/2024 NOV- 04/01/2024 LRF- 03/15/2023 Outpatient Medication Detail   Disp Refills Start End   sertraline  (ZOLOFT ) 50 MG tablet 90 tablet 3 03/15/2023 --   Sig - Route: TAKE 1 TABLET BY MOUTH EVERY DAY - Oral   Sent to pharmacy as: sertraline  (ZOLOFT ) 50 MG tablet   E-Prescribing Status: Receipt confirmed by pharmacy (03/15/2023 10:00 AM EST)   Renewals  Renewal provider: Emilio Kelly DASEN, FNP

## 2024-02-26 NOTE — Telephone Encounter (Signed)
[facility] faxed refill request for the following medications:  sertraline (ZOLOFT) 50 MG tablet   Please advise.  

## 2024-04-01 ENCOUNTER — Encounter: Admitting: Family Medicine

## 2024-04-03 ENCOUNTER — Ambulatory Visit

## 2024-04-03 VITALS — BP 113/81 | HR 84 | Temp 98.5°F | Wt 173.7 lb

## 2024-04-03 DIAGNOSIS — G4700 Insomnia, unspecified: Secondary | ICD-10-CM | POA: Diagnosis not present

## 2024-04-03 DIAGNOSIS — M8580 Other specified disorders of bone density and structure, unspecified site: Secondary | ICD-10-CM | POA: Diagnosis not present

## 2024-04-03 DIAGNOSIS — Z1211 Encounter for screening for malignant neoplasm of colon: Secondary | ICD-10-CM | POA: Diagnosis not present

## 2024-04-03 DIAGNOSIS — E890 Postprocedural hypothyroidism: Secondary | ICD-10-CM | POA: Diagnosis not present

## 2024-04-03 DIAGNOSIS — G8929 Other chronic pain: Secondary | ICD-10-CM | POA: Diagnosis not present

## 2024-04-03 DIAGNOSIS — M545 Low back pain, unspecified: Secondary | ICD-10-CM

## 2024-04-03 DIAGNOSIS — F419 Anxiety disorder, unspecified: Secondary | ICD-10-CM | POA: Diagnosis not present

## 2024-04-03 DIAGNOSIS — Z1231 Encounter for screening mammogram for malignant neoplasm of breast: Secondary | ICD-10-CM

## 2024-04-03 DIAGNOSIS — Z0000 Encounter for general adult medical examination without abnormal findings: Secondary | ICD-10-CM

## 2024-04-03 DIAGNOSIS — Z0001 Encounter for general adult medical examination with abnormal findings: Secondary | ICD-10-CM

## 2024-04-03 MED ORDER — SERTRALINE HCL 50 MG PO TABS: 50.0000 mg | ORAL_TABLET | Freq: Every day | ORAL | 3 refills | Status: AC

## 2024-04-03 MED ORDER — ALPRAZOLAM 0.25 MG PO TABS: 0.2500 mg | ORAL_TABLET | Freq: Every evening | ORAL | 0 refills | Status: AC | PRN

## 2024-04-03 NOTE — Patient Instructions (Signed)
 Goal should be to get 1200 mg calcium and 600 international units of vitamin D  daily (diet + supplementation).

## 2024-04-03 NOTE — Progress Notes (Signed)
 "    Complete physical exam   Patient: Lisa Simmons   DOB: Apr 13, 1960   64 y.o. Female  MRN: [id number] Visit Date: 04/03/2024  Today's healthcare provider: Isaiah DELENA Pepper, MD   Chief Complaint  Patient presents with   Annual Exam    Would like a handicap placard   Subjective    Lisa Simmons is a 64 y.o. female who presents today for a complete physical exam.   Discussed the use of AI scribe software for clinical note transcription with the patient, who gave verbal consent to proceed.  History of Present Illness Lisa Simmons is a 64 year old female who presents for a handicap placard and medication refills.  She experiences severe pain in her lower back and hips, described as 'massive pressure just pushing down,' which varies in intensity and sometimes makes her feel like she might fall. She has been diagnosed with bulging discs and a hemangioma on her spine, identified through a CT scan. She has not seen an orthopedic doctor in about two years and is not interested in pursuing surgery at this time. She requests a handicap placard due to difficulty walking more than 200 ft without stopping to rest.  She alternates between two thyroid  medications and is under the care of an endocrinologist. She takes Singulair for allergies, sertraline  (Zoloft ) 50 mg, and occasionally Xanax  for sleep issues, noting that she takes it less often than once a month. She is also on Prilosec for acid reflux.  She has a history of diverticulitis and low bone density. She takes vitamin D  but not calcium, and she inquires about the appropriate calcium dosage.   She engages in [place] [employer] at home and swimming during the summer, though walking can sometimes exacerbate her pain. She quit smoking in 1988 and rarely consumes alcohol.  The 10-year ASCVD risk score (Arnett DK, et al., 2019) is: 2.7%   Last depression screening scores    04/03/2024    8:17 AM 02/04/2024   10:47 AM 10/16/2023    1:33 PM   PHQ 2/9 Scores  PHQ - 2 Score 4 0 0  PHQ- 9 Score 11 6 6       Data saved with a previous flowsheet row definition   Last fall risk screening    02/04/2024   10:47 AM  Fall Risk   Falls in the past year? 0  Number falls in past yr: 0  Injury with Fall? 0      Data saved with a previous flowsheet row definition      Medications: Show/hide medication list[1]  Review of Systems as noted in HPI.     Objective    BP 113/81   Pulse 84   Temp 98.5 F (36.9 C) (Oral)   Wt 173 lb 11.2 oz (78.8 kg)   LMP 07/14/2001   SpO2 97%   BMI 30.77 kg/m    Physical Exam Constitutional:      Appearance: Normal appearance.  HENT:     Head: Normocephalic and atraumatic.     Mouth/Throat:     Mouth: Mucous membranes are moist.  Eyes:     Pupils: Pupils are equal, round, and reactive to light.  Cardiovascular:     Rate and Rhythm: Normal rate and regular rhythm.     Heart sounds: Normal heart sounds.  Pulmonary:     Effort: Pulmonary effort is normal.     Breath sounds: Normal breath sounds.  Musculoskeletal:  Right lower leg: No edema.     Left lower leg: No edema.  Skin:    General: Skin is warm.  Neurological:     General: No focal deficit present.     Mental Status: She is alert.      No results found for any visits on 04/03/24.  Assessment & Plan    Routine Health Maintenance and Physical Exam    Immunization History  Administered Date(s) Administered   Influenza,inj,Quad PF,6+ Mos 03/11/2019   Influenza-Unspecified 02/02/2015, 01/01/2017, 01/31/2018   MMR 02/25/2003   Tdap 03/10/2013    Health Maintenance  Topic Date Due   COVID-19 Vaccine (1) 04/19/2024 (Originally 12/19/1960)   Influenza Vaccine  06/17/2024 (Originally 10/19/2023)   Zoster Vaccines- Shingrix (1 of 2) 07/02/2024 (Originally 06/18/1981)   DTaP/Tdap/Td (2 - Td or Tdap) 04/03/2025 (Originally 03/11/2023)   Pneumococcal Vaccine: 50+ Years (1 of 1 - PCV) 04/03/2025 (Originally 06/18/2012)    Fecal DNA (Cologuard)  05/10/2024   Mammogram  04/04/2025   Cervical Cancer Screening (HPV/Pap Cotest)  02/25/2027   HPV VACCINES (No Doses Required) Completed   Hepatitis C Screening  Completed   HIV Screening  Completed   Hepatitis B Vaccines 19-59 Average Risk  Aged Out   Meningococcal B Vaccine  Aged Out    Discussed health benefits of physical activity, and encouraged her to engage in regular exercise appropriate for her age and condition.  Problem List Items Addressed This Visit       Endocrine   Hypothyroidism, postop   Relevant Medications   levothyroxine (SYNTHROID) 88 MCG tablet     Musculoskeletal and Integument   Osteopenia     Other   Anxiety   Relevant Medications   sertraline  (ZOLOFT ) 50 MG tablet   ALPRAZolam  (XANAX ) 0.25 MG tablet   Insomnia   Relevant Medications   ALPRAZolam  (XANAX ) 0.25 MG tablet   Annual physical exam - Primary   Screening mammogram for breast cancer   Relevant Orders   MM 3D SCREENING MAMMOGRAM BILATERAL BREAST   Chronic midline low back pain   Relevant Medications   sertraline  (ZOLOFT ) 50 MG tablet   Other Visit Diagnoses       Screening for colon cancer       Relevant Orders   Cologuard      Assessment & Plan Annual Wellness Exam Routine health maintenance discussed. Mammogram due annually, last in 2023. Cologuard due February 2026. Pap smear up to date. Lab work from November reviewed. Declines vaccines. - Ordered mammogram for [facility]. - Ordered Cologuard test for February 2026. - Continue routine health maintenance schedule.  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 Previously evaluated by orthopedics. Patient does not want to pursue surgery at this time. - Signed handicap placard for mobility assistance. - Offered referral to orthopedic specialist if desired.  Anxiety Chronic, well controlled on Zoloft . Continue Zoloft   50mg .  Insomnia Intermittent insomnia managed with Xanax  as needed. Takes sparingly, about once per month. - [facility] - PDMP appropriate  Hypothyroidism Managed with alternating thyroid  medications. Regular follow-up with endocrinologist. TSH levels monitored. - Continue current thyroid  medication regimen. - follow up with endocrinology  Osteopenia Taking vitamin D , no calcium supplementation. - Recommended calcium supplementation of 1200 mg daily, including dietary sources and supplements.    Return in about 1 year (around 04/03/2025) for Annual Physical Exam.     Isaiah DELENA Pepper, MD  [facility] [phone] (phone) [phone] (fax)     [1]  Outpatient Medications Prior to Visit  Medication Sig   albuterol  (VENTOLIN  HFA) 108 (90 Base) MCG/ACT inhaler Inhale 2 puffs into the lungs every 6 (six) hours as needed for wheezing or shortness of breath.   levothyroxine (SYNTHROID) 100 MCG tablet Take 100 mcg by mouth daily.   levothyroxine (SYNTHROID) 88 MCG tablet Take 88 mcg by mouth daily before breakfast.   montelukast (SINGULAIR) 10 MG tablet Take 10 mg by mouth daily.   Multiple Vitamins-Minerals (WOMENS MULTIVITAMIN PO) Take 1 capsule by mouth daily.   omeprazole  (PRILOSEC) 40 MG capsule TAKE 1 CAPSULE BY MOUTH EVERY DAY   Probiotic Product (ALIGN PO) Take 2 capsules by mouth daily.   [DISCONTINUED] ALPRAZolam  (XANAX ) 0.25 MG tablet Take 1 tablet (0.25 mg total) by mouth at bedtime as needed for anxiety.   [DISCONTINUED] sertraline  (ZOLOFT ) 50 MG tablet Take 1 tablet (50 mg total) by mouth daily.   No facility-administered medications prior to visit.   "

## 2024-04-09 ENCOUNTER — Encounter

## 2024-04-14 ENCOUNTER — Encounter

## 2024-07-07 ENCOUNTER — Encounter: Admitting: Dermatology

## 2025-04-07 ENCOUNTER — Encounter
